# Patient Record
Sex: Male | Born: 1990 | Race: Black or African American | Hispanic: No | Marital: Single | Smoking: Current some day smoker
Health system: Southern US, Community
[De-identification: ages and names within clinical notes are randomized; demographics above are authoritative.]

## PROBLEM LIST (undated history)

## (undated) DIAGNOSIS — J45909 Unspecified asthma, uncomplicated: Secondary | ICD-10-CM

## (undated) HISTORY — DX: Unspecified asthma, uncomplicated: J45.909

---
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
## 2012-07-15 ENCOUNTER — Emergency Department (HOSPITAL_COMMUNITY)
Admission: EM | Admit: 2012-07-15 | Discharge: 2012-07-15 | Disposition: A | Discharge status: Home / Self Care | Payer: Medicaid Other | Source: Home / Self Care

## 2013-11-30 ENCOUNTER — Ambulatory Visit: Payer: Self-pay

## 2013-11-30 ENCOUNTER — Ambulatory Visit: Payer: Self-pay | Admitting: Family Medicine

## 2013-11-30 VITALS — BP 98/60 | HR 66 | Temp 98.2°F | Ht 66.0 in | Wt 130.0 lb

## 2013-11-30 DIAGNOSIS — M25521 Pain in right elbow: Secondary | ICD-10-CM

## 2013-11-30 DIAGNOSIS — M25529 Pain in unspecified elbow: Secondary | ICD-10-CM

## 2013-11-30 DIAGNOSIS — I8390 Asymptomatic varicose veins of unspecified lower extremity: Secondary | ICD-10-CM

## 2013-11-30 MED ORDER — PREDNISONE 20 MG PO TABS: ORAL_TABLET | ORAL | Status: DC

## 2013-11-30 NOTE — Patient Instructions (Signed)
Compression stockings:  [facility] in [place]:  [phone] 30-40 compression Open toe Beige or black Medium Knee high    Varicose Veins Varicose veins are veins that have become enlarged and twisted. CAUSES This condition is the result of valves in the veins not working properly. Valves in the veins help return blood from the leg to the heart. If these valves are damaged, blood flows backwards and backs up into the veins in the leg near the skin. This causes the veins to become larger. People who are on their feet a lot, who are pregnant, or who are overweight are more likely to develop varicose veins. SYMPTOMS   Bulging, twisted-appearing, bluish veins, most commonly found on the legs.  Leg pain or a feeling of heaviness. These symptoms may be worse at the end of the day.  Leg swelling.  Skin color changes. DIAGNOSIS  Varicose veins can usually be diagnosed with an exam of your legs by your caregiver. He or she may recommend an ultrasound of your leg veins. TREATMENT  Most varicose veins can be treated at home.However, other treatments are available for people who have persistent symptoms or who want to treat the cosmetic appearance of the varicose veins. These include:  Laser treatment of very small varicose veins.  Medicine that is shot (injected) into the vein. This medicine hardens the walls of the vein and closes off the vein. This treatment is called sclerotherapy. Afterwards, you may need to wear clothing or bandages that apply pressure.  Surgery. HOME CARE INSTRUCTIONS   Do not stand or sit in one position for long periods of time. Do not sit with your legs crossed. Rest with your legs raised during the day.  Wear elastic stockings or support hose. Do not wear other tight, encircling garments around the legs, pelvis, or waist.  Walk as much as possible to increase blood flow.  Raise the foot of your bed at night with 2-inch blocks.  If you get a cut in  the skin over the vein and the vein bleeds, lie down with your leg raised and press on it with a clean cloth until the bleeding stops. Then place a bandage (dressing) on the cut. See your caregiver if it continues to bleed or needs stitches. SEEK MEDICAL CARE IF:   The skin around your ankle starts to break down.  You have pain, redness, tenderness, or hard swelling developing in your leg over a vein.  You are uncomfortable due to leg pain. Document Released: 04/05/2005 Document Revised: 09/18/2011 Document Reviewed: 08/22/2010 [facility] Patient Information 2014 [place], [place].

## 2013-11-30 NOTE — Progress Notes (Addendum)
This is a 23 year old gentleman who works at [employer] He's had 2-3 months of pain in his right elbow, unrelated to any trauma. Has a popping sound as he pronates and supinates his wrist.  Patient also has a swelling on his left lateral Mid anterior shin that pops up when he stands and disappears when he lies down.  Minimal tenderness.  Objective:  NAD Patient has full range of motion of his right elbow. He has no real tenderness on palpation of the forearm or the epicondyles. Nevertheless when he pronates and supinates, there is a definite crepitus (standing sound).  The left leg shows a 1 cm area of swelling in the mid shaft of the fibula. [facility] reading (PRIMARY) by  Dr. Milus Glazier:  Negative right elbow.   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 The right elbow apparently has acute inflammation from an irritated band of tendon.  Elbow pain, right - Plan: DG Elbow Complete Right, predniSONE (DELTASONE) 20 MG tablet  Varicose vein of leg --> compression stockings.  Will refer if patient requests Signed, Elvina Sidle, MD

## 2015-04-25 ENCOUNTER — Encounter (HOSPITAL_COMMUNITY): Payer: Self-pay | Admitting: *Deleted

## 2015-04-25 ENCOUNTER — Emergency Department (HOSPITAL_COMMUNITY)
Admission: EM | Admit: 2015-04-25 | Discharge: 2015-04-25 | Discharge status: Absent without leave | Payer: Self-pay | Source: Home / Self Care

## 2015-04-25 ENCOUNTER — Emergency Department (INDEPENDENT_AMBULATORY_CARE_PROVIDER_SITE_OTHER)
Admission: EM | Admit: 2015-04-25 | Discharge: 2015-04-25 | Disposition: A | Discharge status: Home / Self Care | Payer: Self-pay | Source: Home / Self Care | Attending: Family Medicine | Admitting: Family Medicine

## 2015-04-25 DIAGNOSIS — S29011A Strain of muscle and tendon of front wall of thorax, initial encounter: Secondary | ICD-10-CM

## 2015-04-25 DIAGNOSIS — M7711 Lateral epicondylitis, right elbow: Secondary | ICD-10-CM

## 2015-04-25 MED ORDER — PREDNISONE 20 MG PO TABS: ORAL_TABLET | ORAL | Status: DC

## 2015-04-25 NOTE — ED Provider Notes (Signed)
CSN: [id number]     Arrival date & time 04/25/15  1758 History   First MD Initiated Contact with Patient 04/25/15 1815     Chief Complaint  Patient presents with  . Chest Pain   (Consider location/radiation/quality/duration/timing/severity/associated sxs/prior Treatment) Patient is a 24 y.o. male presenting with chest pain. The history is provided by the patient.  Chest Pain Pain location:  L chest Pain quality: aching and burning   Pain radiates to:  Does not radiate Pain radiates to the back: no   Pain severity:  Moderate Onset quality:  Gradual Timing:  Intermittent Progression:  Worsening Chronicity:  New Context: breathing, lifting, movement and raising an arm   Relieved by:  Nothing Worsened by:  Coughing Associated symptoms: cough    this is a 24 year old man who works at [employer] for the last 6 weeks. He's developed sharp pain in his left lateral chest. 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 He had a mild cough over the last 5 days as well.  Patient also has asthma which is been recently well-controlled using his inhalers.  Past Medical History  Diagnosis Date  . Asthma    History reviewed. No pertinent past surgical history. Family History  Problem Relation Age of Onset  . Diabetes Maternal Grandmother    Social History  Substance Use Topics  . Smoking status: Never Smoker   . Smokeless tobacco: None  . Alcohol Use: Yes     Comment: occasional    Review of Systems  Constitutional: Negative.   HENT: Negative.   Eyes: Negative.   Respiratory: Positive for cough.   Cardiovascular: Positive for chest pain.  Musculoskeletal: Negative.    patient does have a click in his right brachioradialis muscle for the last year.  Allergies  Review of patient's allergies indicates no known allergies.  Home Medications   Prior to Admission medications   Medication Sig Start Date End Date Taking? Authorizing Provider  Albuterol Sulfate (PROAIR HFA IN) Inhale into the lungs.   Yes  Historical Provider, MD  Cetirizine HCl (ZYRTEC PO) Take by mouth.    Historical Provider, MD  predniSONE (DELTASONE) 20 MG tablet 2 daily with food 11/30/13   Elvina Sidle, MD   Meds Ordered and Administered this Visit  Medications - No data to display  BP 123/67 mmHg  Pulse 64  Temp(Src) 98.3 F (36.8 C) (Oral)  Resp 16  SpO2 100% No data found.   Physical Exam  Constitutional: He appears well-developed.  HENT:  Head: Normocephalic and atraumatic.  Eyes: Conjunctivae are normal. Pupils are equal, round, and reactive to light.  Neck: Normal range of motion. Neck supple.  Cardiovascular: Normal rate, regular rhythm and normal heart sounds.   Pulmonary/Chest: Effort normal and breath sounds normal.  Musculoskeletal:  Patient has a palpable click when he pronates and supinates his right forearm. The clinic is located over the brachioradialis muscle. There is no ecchymosis and he has full range of motion  Nursing note and vitals reviewed.  patient has tenderness over the fifth rib on the left side  ED Course  Procedures (including critical care time)  Labs Review Labs Reviewed - No data to display      MDM   This chart was scribed in my presence and reviewed by me personally.    ICD-9-CM ICD-10-CM   1. Chest wall muscle strain, initial encounter 848.8 S29.011A   2. Right lateral epicondylitis 726.32 M77.11    treatment is prednisone 40 mg daily 5 and 1  day off from work   Signed, Elvina SidleKurt Kendyl Bissonnette, MD     Elvina SidleKurt Domini Vandehei, MD 04/25/15 [phone]

## 2015-04-25 NOTE — ED Notes (Signed)
C/O left lateral rib pain with coughing; has had productive cough x 1 wk.  Has tried using ProAir, but doesn't notice any change.  Denies fevers.

## 2015-04-25 NOTE — Discharge Instructions (Signed)
° °  You have the muscle in the left chest wall. Usually the cortisone helps with the cough as well as the inflammation where the muscle attaches. Also, this may help with your right tennis elbow.   Muscle Strain A muscle strain is an injury that occurs when a muscle is stretched beyond its normal length. Usually a small number of muscle fibers are torn when this happens. Muscle strain is rated in degrees. First-degree strains have the least amount of muscle fiber tearing and pain. Second-degree and third-degree strains have increasingly more tearing and pain.  Usually, recovery from muscle strain takes 1-2 weeks. Complete healing takes 5-6 weeks.  CAUSES  Muscle strain happens when a sudden, violent force placed on a muscle stretches it too far. This may occur with lifting, sports, or a fall.  RISK FACTORS Muscle strain is especially common in athletes.  SIGNS AND SYMPTOMS At the site of the muscle strain, there may be:  Pain.  Bruising.  Swelling.  Difficulty using the muscle due to pain or lack of normal function. DIAGNOSIS  Your health care provider will perform a physical exam and ask about your medical history. TREATMENT  Often, the best treatment for a muscle strain is resting, icing, and applying cold compresses to the injured area.  HOME CARE INSTRUCTIONS   Use the PRICE method of treatment to promote muscle healing during the first 2-3 days after your injury. The PRICE method involves:  Protecting the muscle from being injured again.  Restricting your activity and resting the injured body part.  Icing your injury. To do this, put ice in a plastic bag. Place a towel between your skin and the bag. Then, apply the ice and leave it on from 15-20 minutes each hour. After the third day, switch to moist heat packs.  Apply compression to the injured area with a splint or elastic bandage. Be careful not to wrap it too tightly. This may interfere with blood circulation or increase  swelling.  Elevate the injured body part above the level of your heart as often as you can.  Only take over-the-counter or prescription medicines for pain, discomfort, or fever as directed by your health care provider.  Warming up prior to exercise helps to prevent future muscle strains. SEEK MEDICAL CARE IF:   You have increasing pain or swelling in the injured area.  You have numbness, tingling, or a significant loss of strength in the injured area. MAKE SURE YOU:   Understand these instructions.  Will watch your condition.  Will get help right away if you are not doing well or get worse.   This information is not intended to replace advice given to you by your health care provider. Make sure you discuss any questions you have with your health care provider.   Document Released: 06/26/2005 Document Revised: 04/16/2013 Document Reviewed: 01/23/2013 Elsevier Interactive Patient Education [employer]2016 Elsevier Inc.

## 2015-07-08 ENCOUNTER — Ambulatory Visit (INDEPENDENT_AMBULATORY_CARE_PROVIDER_SITE_OTHER): Payer: Self-pay | Admitting: Emergency Medicine

## 2015-07-08 DIAGNOSIS — J45909 Unspecified asthma, uncomplicated: Secondary | ICD-10-CM | POA: Insufficient documentation

## 2015-07-08 DIAGNOSIS — J209 Acute bronchitis, unspecified: Secondary | ICD-10-CM

## 2015-07-08 DIAGNOSIS — J4530 Mild persistent asthma, uncomplicated: Secondary | ICD-10-CM

## 2015-07-08 MED ORDER — PROMETHAZINE-CODEINE 6.25-10 MG/5ML PO SYRP
5.0000 mL | ORAL_SOLUTION | ORAL | Status: DC | PRN
Start: 2015-07-08 — End: 2016-05-29

## 2015-07-08 MED ORDER — AZITHROMYCIN 250 MG PO TABS: ORAL_TABLET | ORAL | Status: DC

## 2015-07-08 NOTE — Progress Notes (Signed)
Subjective:  Patient ID: Stephannie Li, male    DOB: Oct 11, 1990  Age: 24 y.o. MRN: [id number]  CC: No chief complaint on file.   HPI Clayvon A Gillie presents  patient has a four-day history of sore throat and hoarseness. He has nasal congestion and postnasal drainage. He has a history of asthma and has some wheezing with minimal shortness breath with exertion. He has a cough productive of purulent sputum. He vomiting or stool change.  History Toshio has a past medical history of Asthma.   He has no past surgical history on file.   His  family history includes Diabetes in his maternal grandmother.  He   reports that he has never smoked. He does not have any smokeless tobacco history on file. He reports that he drinks alcohol. He reports that he does not use illicit drugs.  Outpatient Prescriptions Prior to Visit  Medication Sig Dispense Refill  . Albuterol Sulfate (PROAIR HFA IN) Inhale into the lungs.    . Cetirizine HCl (ZYRTEC PO) Take by mouth.    . predniSONE (DELTASONE) 20 MG tablet Two daily with food 10 tablet 0   No facility-administered medications prior to visit.    Social History   Social History  . Marital Status: Single    Spouse Name: N/A  . Number of Children: N/A  . Years of Education: N/A   Social History Main Topics  . Smoking status: Never Smoker   . Smokeless tobacco: Not on file  . Alcohol Use: Yes     Comment: occasional  . Drug Use: No  . Sexual Activity: Not on file   Other Topics Concern  . Not on file   Social History Narrative     Review of Systems  Constitutional: Positive for fever and fatigue. Negative for chills and appetite change.  HENT: Positive for sore throat. Negative for congestion, ear pain, postnasal drip and sinus pressure.   Eyes: Negative for pain and redness.  Respiratory: Positive for cough. Negative for shortness of breath and wheezing.   Cardiovascular: Negative for leg swelling.  Gastrointestinal: Negative  for nausea, vomiting, abdominal pain, diarrhea, constipation and blood in stool.  Endocrine: Negative for polyuria.  Genitourinary: Negative for dysuria, urgency, frequency and flank pain.  Musculoskeletal: Negative for gait problem.  Skin: Negative for rash.  Neurological: Negative for weakness and headaches.  Psychiatric/Behavioral: Negative for confusion and decreased concentration. The patient is not nervous/anxious.     Objective:  There were no vitals taken for this visit.  Physical Exam  Constitutional: He is oriented to person, place, and time. He appears well-developed and well-nourished. No distress.  HENT:  Head: Normocephalic and atraumatic.  Right Ear: External ear normal.  Left Ear: External ear normal.  Nose: Nose normal.  Eyes: Conjunctivae and EOM are normal. Pupils are equal, round, and reactive to light. No scleral icterus.  Neck: Normal range of motion. Neck supple. No tracheal deviation present.  Cardiovascular: Normal rate, regular rhythm and normal heart sounds.   Pulmonary/Chest: Effort normal. No respiratory distress. He has no wheezes. He has no rales.  Abdominal: He exhibits no mass. There is no tenderness. There is no rebound and no guarding.  Musculoskeletal: He exhibits no edema.  Lymphadenopathy:    He has no cervical adenopathy.  Neurological: He is alert and oriented to person, place, and time. Coordination normal.  Skin: Skin is warm and dry. No rash noted.  Psychiatric: He has a normal mood and affect. His behavior  is normal.      Assessment & Plan:   Diagnoses and all orders for this visit:  Acute bronchitis, unspecified organism  Asthma, mild persistent, uncomplicated  Other orders -     promethazine-codeine (PHENERGAN WITH CODEINE) 6.25-10 MG/5ML syrup; Take 5-10 mLs by mouth every 4 (four) hours as needed for cough. -     azithromycin (ZITHROMAX) 250 MG tablet; Take 2 tabs PO x 1 dose, then 1 tab PO QD x 4 days  I am having Mr.  Katrinka BlazingSmith start on promethazine-codeine and azithromycin. I am also having him maintain his Cetirizine HCl (ZYRTEC PO), Albuterol Sulfate (PROAIR HFA IN), and predniSONE.  Meds ordered this encounter  Medications  . promethazine-codeine (PHENERGAN WITH CODEINE) 6.25-10 MG/5ML syrup    Sig: Take 5-10 mLs by mouth every 4 (four) hours as needed for cough.    Dispense:  120 mL    Refill:  0  . azithromycin (ZITHROMAX) 250 MG tablet    Sig: Take 2 tabs PO x 1 dose, then 1 tab PO QD x 4 days    Dispense:  6 tablet    Refill:  0    Appropriate red flag conditions were discussed with the patient as well as actions that should be taken.  Patient expressed his understanding.  Follow-up: Return if symptoms worsen or fail to improve.  Carmelina DaneAnderson, Nadja Lina S, MD

## 2015-07-08 NOTE — Patient Instructions (Signed)

## 2016-05-29 ENCOUNTER — Ambulatory Visit (INDEPENDENT_AMBULATORY_CARE_PROVIDER_SITE_OTHER): Payer: BLUE CROSS/BLUE SHIELD | Admitting: Family Medicine

## 2016-05-29 ENCOUNTER — Ambulatory Visit (INDEPENDENT_AMBULATORY_CARE_PROVIDER_SITE_OTHER): Payer: BLUE CROSS/BLUE SHIELD

## 2016-05-29 VITALS — BP 122/72 | HR 102 | Temp 101.0°F | Resp 17 | Ht 67.5 in | Wt 130.0 lb

## 2016-05-29 DIAGNOSIS — R059 Cough, unspecified: Secondary | ICD-10-CM

## 2016-05-29 DIAGNOSIS — J181 Lobar pneumonia, unspecified organism: Secondary | ICD-10-CM | POA: Diagnosis not present

## 2016-05-29 DIAGNOSIS — J029 Acute pharyngitis, unspecified: Secondary | ICD-10-CM | POA: Diagnosis not present

## 2016-05-29 DIAGNOSIS — R509 Fever, unspecified: Secondary | ICD-10-CM

## 2016-05-29 DIAGNOSIS — J189 Pneumonia, unspecified organism: Secondary | ICD-10-CM

## 2016-05-29 DIAGNOSIS — J4520 Mild intermittent asthma, uncomplicated: Secondary | ICD-10-CM

## 2016-05-29 DIAGNOSIS — R05 Cough: Secondary | ICD-10-CM

## 2016-05-29 LAB — POCT CBC
Granulocyte percent: 88.5 %G — AB (ref 37–80)
HCT, POC: 41.7 % — AB (ref 43.5–53.7)
Hemoglobin: 14.2 g/dL (ref 14.1–18.1)
Lymph, poc: 0.9 (ref 0.6–3.4)
MCH, POC: 24.9 pg — AB (ref 27–31.2)
MCHC: 34.1 g/dL (ref 31.8–35.4)
MCV: 73 fL — AB (ref 80–97)
MID (cbc): 0.2 (ref 0–0.9)
MPV: 8.6 fL (ref 0–99.8)
POC Granulocyte: 9 — AB (ref 2–6.9)
POC LYMPH PERCENT: 9.1 %L — AB (ref 10–50)
POC MID %: 2.4 %M (ref 0–12)
Platelet Count, POC: 231 10*3/uL (ref 142–424)
RBC: 5.72 M/uL (ref 4.69–6.13)
RDW, POC: 14.2 %
WBC: 10.2 10*3/uL (ref 4.6–10.2)

## 2016-05-29 LAB — POCT RAPID STREP A (OFFICE): Rapid Strep A Screen: NEGATIVE

## 2016-05-29 LAB — POCT INFLUENZA A/B
Influenza A, POC: NEGATIVE
Influenza B, POC: NEGATIVE

## 2016-05-29 MED ORDER — AZITHROMYCIN 250 MG PO TABS
ORAL_TABLET | ORAL | 0 refills | Status: DC
Start: 2016-05-29 — End: 2016-06-02

## 2016-05-29 MED ORDER — ALBUTEROL SULFATE HFA 108 (90 BASE) MCG/ACT IN AERS: 1.0000 | INHALATION_SPRAY | RESPIRATORY_TRACT | 0 refills | Status: DC | PRN

## 2016-05-29 MED ORDER — ALBUTEROL SULFATE (2.5 MG/3ML) 0.083% IN NEBU
2.5000 mg | INHALATION_SOLUTION | Freq: Once | RESPIRATORY_TRACT | Status: AC
  Administered 2016-05-29: 2.5 mg via RESPIRATORY_TRACT

## 2016-05-29 NOTE — Patient Instructions (Addendum)
I'm suspicious for an early pneumonia based on your symptoms and exam. The x-ray and blood work were overall reassuring. Your strep test and flu tests were also negative. Start antibiotic today, 2 pills today then 1 pill per day for the next 4 days. Albuterol inhaler up to every 4 hours as needed. If you aren't improving, follow-up with me on Friday, if any persistent fevers, or not improving by Wednesday, return for recheck then.   Return to the clinic or go to the nearest emergency room if any of your symptoms worsen or new symptoms occur.   Community-Acquired Pneumonia, Adult Pneumonia is an infection of the lungs. There are different types of pneumonia. One type can develop while a person is in a hospital. A different type, called community-acquired pneumonia, develops in people who are not, or have not recently been, in the hospital or other health care facility. What are the causes? Pneumonia may be caused by bacteria, viruses, or funguses. Community-acquired pneumonia is often caused by Streptococcus pneumonia bacteria. These bacteria are often passed from one person to another by breathing in droplets from the cough or sneeze of an infected person. What increases the risk? The condition is more likely to develop in:  People who havechronic diseases, such as chronic obstructive pulmonary disease (COPD), asthma, congestive heart failure, cystic fibrosis, diabetes, or kidney disease.  People who haveearly-stage or late-stage HIV.  People who havesickle cell disease.  People who havehad their spleen removed (splenectomy).  People who havepoor Administrator.  People who havemedical conditions that increase the risk of breathing in (aspirating) secretions their own mouth and nose.  People who havea weakened immune system (immunocompromised).  People who smoke.  People whotravel to areas where pneumonia-causing germs commonly exist.  People whoare around animal habitats or  animals that have pneumonia-causing germs, including birds, bats, rabbits, cats, and farm animals. What are the signs or symptoms? Symptoms of this condition include:  Adry cough.  A wet (productive) cough.  Fever.  Sweating.  Chest pain, especially when breathing deeply or coughing.  Rapid breathing or difficulty breathing.  Shortness of breath.  Shaking chills.  Fatigue.  Muscle aches. How is this diagnosed? Your health care provider will take a medical history and perform a physical exam. You may also have other tests, including:  Imaging studies of your chest, including X-rays.  Tests to check your blood oxygen level and other blood gases.  Other tests on blood, mucus (sputum), fluid around your lungs (pleural fluid), and urine. If your pneumonia is severe, other tests may be done to identify the specific cause of your illness. How is this treated? The type of treatment that you receive depends on many factors, such as the cause of your pneumonia, the medicines you take, and other medical conditions that you have. For most adults, treatment and recovery from pneumonia may occur at home. In some cases, treatment must happen in a hospital. Treatment may include:  Antibiotic medicines, if the pneumonia was caused by bacteria.  Antiviral medicines, if the pneumonia was caused by a virus.  Medicines that are given by mouth or through an IV tube.  Oxygen.  Respiratory therapy. Although rare, treating severe pneumonia may include:  Mechanical ventilation. This is done if you are not breathing well on your own and you cannot maintain a safe blood oxygen level.  Thoracentesis. This procedureremoves fluid around one lung or both lungs to help you breathe better. Follow these instructions at home:  Take over-the-counter and  prescription medicines only as told by your health care provider.  Only takecough medicine if you are losing sleep. Understand that cough medicine  can prevent your body's natural ability to remove mucus from your lungs.  If you were prescribed an antibiotic medicine, take it as told by your health care provider. Do not stop taking the antibiotic even if you start to feel better.  Sleep in a semi-upright position at night. Try sleeping in a reclining chair, or place a few pillows under your head.  Do not use tobacco products, including cigarettes, chewing tobacco, and e-cigarettes. If you need help quitting, ask your health care provider.  Drink enough water to keep your urine clear or pale yellow. This will help to thin out mucus secretions in your lungs. How is this prevented? There are ways that you can decrease your risk of developing community-acquired pneumonia. Consider getting a pneumococcal vaccine if:  You are older than 23 years of age.  You are older than 23 years of age and are undergoing cancer treatment, have chronic lung disease, or have other medical conditions that affect your immune system. Ask your health care provider if this applies to you. There are different types and schedules of pneumococcal vaccines. Ask your health care provider which vaccination option is best for you. You may also prevent community-acquired pneumonia if you take these actions:  Get an influenza vaccine every year. Ask your health care provider which type of influenza vaccine is best for you.  Go to the dentist on a regular basis.  Wash your hands often. Use hand sanitizer if soap and water are not available. Contact a health care provider if:  You have a fever.  You are losing sleep because you cannot control your cough with cough medicine. Get help right away if:  You have worsening shortness of breath.  You have increased chest pain.  Your sickness becomes worse, especially if you are an older adult or have a weakened immune system.  You cough up blood. This information is not intended to replace advice given to you by your  health care provider. Make sure you discuss any questions you have with your health care provider. Document Released: 06/26/2005 Document Revised: 11/04/2015 Document Reviewed: 10/21/2014 Elsevier Interactive Patient Education  2017 [employer]  Bronchospasm, Adult A bronchospasm is a spasm or tightening of the airways going into the lungs. During a bronchospasm breathing becomes more difficult because the airways get smaller. When this happens there can be coughing, a whistling sound when breathing (wheezing), and difficulty breathing. Bronchospasm is often associated with asthma, but not all patients who experience a bronchospasm have asthma. What are the causes? A bronchospasm is caused by inflammation or irritation of the airways. The inflammation or irritation may be triggered by:  Allergies (such as to animals, pollen, food, or mold). Allergens that cause bronchospasm may cause wheezing immediately after exposure or many hours later.  Infection. Viral infections are believed to be the most common cause of bronchospasm.  Exercise.  Irritants (such as pollution, cigarette smoke, strong odors, aerosol sprays, and paint fumes).  Weather changes. Winds increase molds and pollens in the air. Rain refreshes the air by washing irritants out. Cold air may cause inflammation.  Stress and emotional upset. What are the signs or symptoms?  Wheezing.  Excessive nighttime coughing.  Frequent or severe coughing with a simple cold.  Chest tightness.  Shortness of breath. How is this diagnosed? Bronchospasm is usually diagnosed through a history and  physical exam. Tests, such as chest X-rays, are sometimes done to look for other conditions. How is this treated?  Inhaled medicines can be given to open up your airways and help you breathe. The medicines can be given using either an inhaler or a nebulizer machine.  Corticosteroid medicines may be given for severe bronchospasm, usually when  it is associated with asthma. Follow these instructions at home:  Always have a plan prepared for seeking medical care. Know when to call your health care provider and local emergency services (911 in the [place] Know where you can access local emergency care.  Only take medicines as directed by your health care provider.  If you were prescribed an inhaler or nebulizer machine, ask your health care provider to explain how to use it correctly. Always use a spacer with your inhaler if you were given one.  It is necessary to remain calm during an attack. Try to relax and breathe more slowly.  Control your home environment in the following ways:  Change your heating and air conditioning filter at least once a month.  Limit your use of fireplaces and wood stoves.  Do not smoke and do not allow smoking in your home.  Avoid exposure to perfumes and fragrances.  Get rid of pests (such as roaches and mice) and their droppings.  Throw away plants if you see mold on them.  Keep your house clean and dust free.  Replace carpet with wood, tile, or vinyl flooring. Carpet can trap dander and dust.  Use allergy-proof pillows, mattress covers, and box spring covers.  Wash bed sheets and blankets every week in hot water and dry them in a dryer.  Use blankets that are made of polyester or cotton.  Wash hands frequently. Contact a health care provider if:  You have muscle aches.  You have chest pain.  The sputum changes from clear or white to yellow, green, gray, or bloody.  The sputum you cough up gets thicker.  There are problems that may be related to the medicine you are given, such as a rash, itching, swelling, or trouble breathing. Get help right away if:  You have worsening wheezing and coughing even after taking your prescribed medicines.  You have increased difficulty breathing.  You develop severe chest pain. This information is not intended to replace advice given to you by  your health care provider. Make sure you discuss any questions you have with your health care provider. Document Released: 06/29/2003 Document Revised: 12/02/2015 Document Reviewed: 12/16/2012 [employer] Interactive Patient Education  2017 [employer]     IF you received an x-ray today, you [facility]. Please contact [facility] at [phone] with questions or concerns regarding your invoice.   IF you received labwork today, you will receive an invoice from [facility]. Please contact [facility] at [phone] with questions or concerns regarding your invoice.   Our billing staff will not be able to assist you with questions regarding bills from these companies.  You will be contacted with the lab results as soon as they are available. The fastest way to get your results is to activate your My Chart account. Instructions are located on the last page of this paperwork. If you have not heard from us regarding the results in 2 weeks, please contact this office.

## 2016-05-29 NOTE — Progress Notes (Signed)
Subjective:  By signing my name below, I, [name], attest that this documentation has been prepared under the direction and in the presence of Shade Flood, MD Electronically Signed: Charline Bills, ED Scribe 05/29/2016 at 9:53 AM.   Patient ID: Dustin Weber, male    DOB: 1991-03-05, 25 y.o.   MRN: [id number]  Chief Complaint  Patient presents with  . Cough  . URI   HPI  HPI Comments: Dustin Weber is a 25 y.o. male, with a h/o asthma, who presents to the [facility] complaining of ongoing cough for the past 3 days. Pt reports associated symptoms of sore throat, HA, mild wheezing and generalized body aches. Pt has tried OTC cough and congestion medication every 4-6 hours for the past 2 days without significant relief. He does not currently have an albuterol inhaler but has used one in the past for asthma. Pt states that his older brother was ill with similar symptoms last week. He denies recent travel. Pt did not receive a flu vaccine this season. He works for [employer] and recently started working for [employer] for [employer] in October.  Patient Active Problem List   Diagnosis Date Noted  . Asthma 07/08/2015   Past Medical History:  Diagnosis Date  . Asthma    No past surgical history on file. No Known Allergies Prior to Admission medications   Medication Sig Start Date End Date Taking? Authorizing Provider  Homeopathic Products ([facility] ELDERBERRY COLD+FLU PO) Take by mouth.   Yes Historical Provider, MD  Albuterol Sulfate (PROAIR HFA IN) Inhale into the lungs.    Historical Provider, MD   Social History   Social History  . Marital status: Single    Spouse name: N/A  . Number of children: N/A  . Years of education: N/A   Occupational History  . Not on file.   Social History Main Topics  . Smoking status: Current Every Day Smoker    Packs/day: 0.20    Years: 3.00  . Smokeless tobacco: Never Used  . Alcohol use Yes     Comment: occasional  .  Drug use: No  . Sexual activity: No   Other Topics Concern  . Not on file   Social History Narrative  . No narrative on file   Review of Systems  HENT: Positive for sore throat.   Respiratory: Positive for cough and wheezing.   Musculoskeletal: Positive for myalgias.  Neurological: Positive for headaches.   BP 122/72 (BP Location: Right Arm, Patient Position: Sitting, Cuff Size: Normal)   Pulse (!) 102   Temp (!) 101 F (38.3 C) (Oral)   Resp 17   Ht 5' 7.5" (1.715 m)   Wt 130 lb (59 kg)   SpO2 97%   BMI 20.06 kg/m     Objective:   Physical Exam  Constitutional: He is oriented to person, place, and time. He appears well-developed and well-nourished.  HENT:  Head: Normocephalic and atraumatic.  Right Ear: Tympanic membrane, external ear and ear canal normal.  Left Ear: Tympanic membrane, external ear and ear canal normal.  Nose: No rhinorrhea.  Mouth/Throat: Mucous membranes are normal. Posterior oropharyngeal erythema present. No oropharyngeal exudate.  Eyes: Conjunctivae are normal. Pupils are equal, round, and reactive to light.  Neck: Neck supple.  Cardiovascular: Normal rate, regular rhythm, normal heart sounds and intact distal pulses.   No murmur heard. Pulmonary/Chest: Effort normal. He has wheezes (faint, expiratory on the R and slight inspiratory wheeze).  He has rhonchi in the left lower field. He has no rales.  Few coarse scattered breath sounds  Abdominal: Soft. There is no tenderness.  Lymphadenopathy:    He has no cervical adenopathy.  Neurological: He is alert and oriented to person, place, and time.  Skin: Skin is warm and dry. No rash noted.  Psychiatric: He has a normal mood and affect. His behavior is normal.  Vitals reviewed.  Results for orders placed or performed in visit on 05/29/16  POCT CBC  Result Value Ref Range   WBC 10.2 4.6 - 10.2 K/uL   Lymph, poc 0.9 0.6 - 3.4   POC LYMPH PERCENT 9.1 (A) 10 - 50 %L   MID (cbc) 0.2 0 - 0.9   POC  MID % 2.4 0 - 12 %M   POC Granulocyte 9.0 (A) 2 - 6.9   Granulocyte percent 88.5 (A) 37 - 80 %G   RBC 5.72 4.69 - 6.13 M/uL   Hemoglobin 14.2 14.1 - 18.1 g/dL   HCT, POC 16.141.7 (A) 09.643.5 - 53.7 %   MCV 73.0 (A) 80 - 97 fL   MCH, POC 24.9 (A) 27 - 31.2 pg   MCHC 34.1 31.8 - 35.4 g/dL   RDW, POC 04.514.2 %   Platelet Count, POC 231 142 - 424 K/uL   MPV 8.6 0 - 99.8 fL  POCT Influenza A/B  Result Value Ref Range   Influenza A, POC Negative Negative   Influenza B, POC Negative Negative  POCT rapid strep A  Result Value Ref Range   Rapid Strep A Screen Negative Negative      Assessment & Plan:   10:33 AM- Albuterol 2.5 neb was given. Wheeze has resolved and improved. Increased aeration left lower lobe but still few rhonchi.   Dustin Weber is a 25 y.o. male Community acquired pneumonia of left lower lobe of lung (HCC) - Plan: azithromycin (ZITHROMAX) 250 MG tablet  Cough - Plan: POCT CBC, POCT Influenza A/B, DG Chest 2 View  Fever, unspecified fever cause - Plan: POCT CBC, POCT Influenza A/B, DG Chest 2 View  Mild intermittent asthma, unspecified whether complicated - Plan: albuterol (PROVENTIL) (2.5 MG/3ML) 0.083% nebulizer solution 2.5 mg, albuterol (PROVENTIL HFA;VENTOLIN HFA) 108 (90 Base) MCG/ACT inhaler  Sore throat - Plan: POCT rapid strep A   Three-day history of fever, myalgias, cough. Few rhonchi left lower lobe with wheezing history of asthma. Somewhat improved after neb, but persistent rhonchi left lower lobe concerning for early community acquired pneumonia.   -Start azithromycin Z-Pak  -Albuterol neb given with improvement., proair HFA given every 4 hours when necessary.  - Recheck in 2 days if not significantly improved, otherwise 4 days with me. RTC precautions discussed. Out of work note given.  Meds ordered this encounter  Medications  . Homeopathic Products (UMCKA ELDERBERRY COLD+FLU PO)    Sig: Take by mouth.  Marland Kitchen. albuterol (PROVENTIL) (2.5 MG/3ML) 0.083%  nebulizer solution 2.5 mg  . albuterol (PROVENTIL HFA;VENTOLIN HFA) 108 (90 Base) MCG/ACT inhaler    Sig: Inhale 1-2 puffs into the lungs every 4 (four) hours as needed for wheezing or shortness of breath.    Dispense:  1 Inhaler    Refill:  0  . azithromycin (ZITHROMAX) 250 MG tablet    Sig: Take 2 pills by mouth on day 1, then 1 pill by mouth per day on days 2 through 5.    Dispense:  6 tablet    Refill:  0  Patient Instructions    I'm suspicious for an early pneumonia based on your symptoms and exam. The x-ray and blood work were overall reassuring. Your strep test and flu tests were also negative. Start antibiotic today, 2 pills today then 1 pill per day for the next 4 days. Albuterol inhaler up to every 4 hours as needed. If you aren't improving, follow-up with me on Friday, if any persistent fevers, or not improving by Wednesday, return for recheck then.   Return to the clinic or go to the nearest emergency room if any of your symptoms worsen or new symptoms occur.   Community-Acquired Pneumonia, Adult Pneumonia is an infection of the lungs. There are different types of pneumonia. One type can develop while a person is in a hospital. A different type, called community-acquired pneumonia, develops in people who are not, or have not recently been, in the hospital or other health care facility. What are the causes? Pneumonia may be caused by bacteria, viruses, or funguses. Community-acquired pneumonia is often caused by Streptococcus pneumonia bacteria. These bacteria are often passed from one person to another by breathing in droplets from the cough or sneeze of an infected person. What increases the risk? The condition is more likely to develop in:  People who havechronic diseases, such as chronic obstructive pulmonary disease (COPD), asthma, congestive heart failure, cystic fibrosis, diabetes, or kidney disease.  People who haveearly-stage or late-stage HIV.  People who  havesickle cell disease.  People who havehad their spleen removed (splenectomy).  People who havepoor Administratordental hygiene.  People who havemedical conditions that increase the risk of breathing in (aspirating) secretions their own mouth and nose.  People who havea weakened immune system (immunocompromised).  People who smoke.  People whotravel to areas where pneumonia-causing germs commonly exist.  People whoare around animal habitats or animals that have pneumonia-causing germs, including birds, bats, rabbits, cats, and farm animals. What are the signs or symptoms? Symptoms of this condition include:  Adry cough.  A wet (productive) cough.  Fever.  Sweating.  Chest pain, especially when breathing deeply or coughing.  Rapid breathing or difficulty breathing.  Shortness of breath.  Shaking chills.  Fatigue.  Muscle aches. How is this diagnosed? Your health care provider will take a medical history and perform a physical exam. You may also have other tests, including:  Imaging studies of your chest, including X-rays.  Tests to check your blood oxygen level and other blood gases.  Other tests on blood, mucus (sputum), fluid around your lungs (pleural fluid), and urine. If your pneumonia is severe, other tests may be done to identify the specific cause of your illness. How is this treated? The type of treatment that you receive depends on many factors, such as the cause of your pneumonia, the medicines you take, and other medical conditions that you have. For most adults, treatment and recovery from pneumonia may occur at home. In some cases, treatment must happen in a hospital. Treatment may include:  Antibiotic medicines, if the pneumonia was caused by bacteria.  Antiviral medicines, if the pneumonia was caused by a virus.  Medicines that are given by mouth or through an IV tube.  Oxygen.  Respiratory therapy. Although rare, treating severe pneumonia may  include:  Mechanical ventilation. This is done if you are not breathing well on your own and you cannot maintain a safe blood oxygen level.  Thoracentesis. This procedureremoves fluid around one lung or both lungs to help you breathe better. Follow these instructions  at home:  Take over-the-counter and prescription medicines only as told by your health care provider.  Only takecough medicine if you are losing sleep. Understand that cough medicine can prevent your body's natural ability to remove mucus from your lungs.  If you were prescribed an antibiotic medicine, take it as told by your health care provider. Do not stop taking the antibiotic even if you start to feel better.  Sleep in a semi-upright position at night. Try sleeping in a reclining chair, or place a few pillows under your head.  Do not use tobacco products, including cigarettes, chewing tobacco, and e-cigarettes. If you need help quitting, ask your health care provider.  Drink enough water to keep your urine clear or pale yellow. This will help to thin out mucus secretions in your lungs. How is this prevented? There are ways that you can decrease your risk of developing community-acquired pneumonia. Consider getting a pneumococcal vaccine if:  You are older than 23 years of age.  You are older than 23 years of age and are undergoing cancer treatment, have chronic lung disease, or have other medical conditions that affect your immune system. Ask your health care provider if this applies to you. There are different types and schedules of pneumococcal vaccines. Ask your health care provider which vaccination option is best for you. You may also prevent community-acquired pneumonia if you take these actions:  Get an influenza vaccine every year. Ask your health care provider which type of influenza vaccine is best for you.  Go to the dentist on a regular basis.  Wash your hands often. Use hand sanitizer if soap and water  are not available. Contact a health care provider if:  You have a fever.  You are losing sleep because you cannot control your cough with cough medicine. Get help right away if:  You have worsening shortness of breath.  You have increased chest pain.  Your sickness becomes worse, especially if you are an older adult or have a weakened immune system.  You cough up blood. This information is not intended to replace advice given to you by your health care provider. Make sure you discuss any questions you have with your health care provider. Document Released: 06/26/2005 Document Revised: 11/04/2015 Document Reviewed: 10/21/2014 Elsevier Interactive Patient Education  2017 [employer]  Bronchospasm, Adult A bronchospasm is a spasm or tightening of the airways going into the lungs. During a bronchospasm breathing becomes more difficult because the airways get smaller. When this happens there can be coughing, a whistling sound when breathing (wheezing), and difficulty breathing. Bronchospasm is often associated with asthma, but not all patients who experience a bronchospasm have asthma. What are the causes? A bronchospasm is caused by inflammation or irritation of the airways. The inflammation or irritation may be triggered by:  Allergies (such as to animals, pollen, food, or mold). Allergens that cause bronchospasm may cause wheezing immediately after exposure or many hours later.  Infection. Viral infections are believed to be the most common cause of bronchospasm.  Exercise.  Irritants (such as pollution, cigarette smoke, strong odors, aerosol sprays, and paint fumes).  Weather changes. Winds increase molds and pollens in the air. Rain refreshes the air by washing irritants out. Cold air may cause inflammation.  Stress and emotional upset. What are the signs or symptoms?  Wheezing.  Excessive nighttime coughing.  Frequent or severe coughing with a simple cold.  Chest  tightness.  Shortness of breath. How is this diagnosed? Bronchospasm is  usually diagnosed through a history and physical exam. Tests, such as chest X-rays, are sometimes done to look for other conditions. How is this treated?  Inhaled medicines can be given to open up your airways and help you breathe. The medicines can be given using either an inhaler or a nebulizer machine.  Corticosteroid medicines may be given for severe bronchospasm, usually when it is associated with asthma. Follow these instructions at home:  Always have a plan prepared for seeking medical care. Know when to call your health care provider and local emergency services (911 in the [place] Know where you can access local emergency care.  Only take medicines as directed by your health care provider.  If you were prescribed an inhaler or nebulizer machine, ask your health care provider to explain how to use it correctly. Always use a spacer with your inhaler if you were given one.  It is necessary to remain calm during an attack. Try to relax and breathe more slowly.  Control your home environment in the following ways:  Change your heating and air conditioning filter at least once a month.  Limit your use of fireplaces and wood stoves.  Do not smoke and do not allow smoking in your home.  Avoid exposure to perfumes and fragrances.  Get rid of pests (such as roaches and mice) and their droppings.  Throw away plants if you see mold on them.  Keep your house clean and dust free.  Replace carpet with wood, tile, or vinyl flooring. Carpet can trap dander and dust.  Use allergy-proof pillows, mattress covers, and box spring covers.  Wash bed sheets and blankets every week in hot water and dry them in a dryer.  Use blankets that are made of polyester or cotton.  Wash hands frequently. Contact a health care provider if:  You have muscle aches.  You have chest pain.  The sputum changes from clear or white  to yellow, green, gray, or bloody.  The sputum you cough up gets thicker.  There are problems that may be related to the medicine you are given, such as a rash, itching, swelling, or trouble breathing. Get help right away if:  You have worsening wheezing and coughing even after taking your prescribed medicines.  You have increased difficulty breathing.  You develop severe chest pain. This information is not intended to replace advice given to you by your health care provider. Make sure you discuss any questions you have with your health care provider. Document Released: 06/29/2003 Document Revised: 12/02/2015 Document Reviewed: 12/16/2012 Elsevier Interactive Patient Education  2017 [employer].     IF you received an x-ray today, you will receive an invoice from [facility]. Please contact [facility] at [phone] with questions or concerns regarding your invoice.   IF you received labwork today, you will receive an invoice from [employer] [facility]. Please contact [facility] at [phone] with questions or concerns regarding your invoice.   Our billing staff will not be able to assist you with questions regarding bills from these companies.  You will be contacted with the lab results as soon as they are available. The fastest way to get your results is to activate your My Chart account. Instructions are located on the last page of this paperwork. If you have not heard from [place] regarding the results in 2 weeks, please contact this office.        I personally performed the services described in this documentation, which was scribed in  my presence. The recorded information has been reviewed and considered, and addended by me as needed.   Signed,   Meredith Staggers, MD [facility].  05/29/16 10:39 AM

## 2016-06-02 ENCOUNTER — Ambulatory Visit (INDEPENDENT_AMBULATORY_CARE_PROVIDER_SITE_OTHER): Payer: BLUE CROSS/BLUE SHIELD | Admitting: Family Medicine

## 2016-06-02 VITALS — BP 120/80 | HR 69 | Temp 98.5°F | Resp 20 | Ht 67.0 in | Wt 128.0 lb

## 2016-06-02 DIAGNOSIS — J4521 Mild intermittent asthma with (acute) exacerbation: Secondary | ICD-10-CM | POA: Diagnosis not present

## 2016-06-02 DIAGNOSIS — J181 Lobar pneumonia, unspecified organism: Secondary | ICD-10-CM

## 2016-06-02 DIAGNOSIS — J189 Pneumonia, unspecified organism: Secondary | ICD-10-CM

## 2016-06-02 MED ORDER — AZITHROMYCIN 250 MG PO TABS: 250.0000 mg | ORAL_TABLET | Freq: Once | ORAL | 0 refills | Status: DC

## 2016-06-02 MED ORDER — AZITHROMYCIN 250 MG PO TABS: 250.0000 mg | ORAL_TABLET | Freq: Once | ORAL | 0 refills | Status: AC

## 2016-06-02 NOTE — Progress Notes (Addendum)
Subjective:    Patient ID: Dustin Weber, male    DOB: September 27, 1990, 25 y.o.   MRN: [id number]  HPI Dustin Weber is a 25 y.o. male Here for follow-up. Evaluated November 20 for cough, suspected early left lower lobe pneumonia with mild asthma flare. Started on azithromycin, ProAir HFA every 4 hours when necessary.   Feeling better. Accidentally threw away last pill.  Has taken 4 of 5 days Rx. No recent fever. Using albuterol 2-3 times per day over past few days and feels like requiring less of it yesterday. Has not used inhaler since last night.  Sleeping ok and eating/drinking ok.   Worked 2 days ago. Went ok.  Feels ready to RTW tonight.   Patient Active Problem List   Diagnosis Date Noted  . Asthma 07/08/2015   Past Medical History:  Diagnosis Date  . Asthma    No past surgical history on file. No Known Allergies Prior to Admission medications   Medication Sig Start Date End Date Taking? Authorizing Provider  albuterol (PROVENTIL HFA;VENTOLIN HFA) 108 (90 Base) MCG/ACT inhaler Inhale 1-2 puffs into the lungs every 4 (four) hours as needed for wheezing or shortness of breath. 05/29/16  Yes Shade FloodJeffrey R Carmelita Amparo, MD  azithromycin (ZITHROMAX) 250 MG tablet Take 2 pills by mouth on day 1, then 1 pill by mouth per day on days 2 through 5. Patient not taking: Reported on 06/02/2016 05/29/16   Shade FloodJeffrey R Tonga Prout, MD  Homeopathic Products [facility] ELDERBERRY COLD+FLU PO) Take by mouth.    Historical Provider, MD   Social History   Social History  . Marital status: Single    Spouse name: N/A  . Number of children: N/A  . Years of education: N/A   Occupational History  . Not on file.   Social History Main Topics  . Smoking status: Current Every Day Smoker    Packs/day: 0.20    Years: 3.00  . Smokeless tobacco: Never Used  . Alcohol use Yes     Comment: occasional  . Drug use: No  . Sexual activity: No   Other Topics Concern  . Not on file   Social History Narrative  . No  narrative on file      Review of Systems  Constitutional: Negative for appetite change, chills and fever.  Respiratory: Positive for cough, shortness of breath (minimal, improves with inhaler. ) and wheezing.        Objective:   Physical Exam  Constitutional: He is oriented to person, place, and time. He appears well-developed and well-nourished.  HENT:  Head: Normocephalic and atraumatic.  Right Ear: Tympanic membrane, external ear and ear canal normal.  Left Ear: Tympanic membrane, external ear and ear canal normal.  Nose: No rhinorrhea.  Mouth/Throat: Oropharynx is clear and moist and mucous membranes are normal. No oropharyngeal exudate or posterior oropharyngeal erythema.  Eyes: Conjunctivae are normal. Pupils are equal, round, and reactive to light.  Neck: Neck supple.  Cardiovascular: Normal rate, regular rhythm, normal heart sounds and intact distal pulses.   No murmur heard. Pulmonary/Chest: Effort normal. He has wheezes (Few scattered wheezes throughout. Possible minimal coarse breath sounds left lower. Good effort, no distress. Speaking in full sentences.) in the right lower field and the left lower field. He has no rhonchi. He has no rales.  Abdominal: Soft. There is no tenderness.  Lymphadenopathy:    He has no cervical adenopathy.  Neurological: He is alert and oriented to person, place, and time.  Skin: Skin is warm and dry. No rash noted.  Psychiatric: He has a normal mood and affect. His behavior is normal.  Vitals reviewed.  Vitals:   06/02/16 0849  BP: 120/80  Pulse: 69  Resp: 20  Temp: 98.5 F (36.9 C)  TempSrc: Oral  SpO2: 100%  Weight: 128 lb (58.1 kg)  Height: 5\' 7"  (1.702 m)      Assessment & Plan:    Dustin Weber is a 25 y.o. male Pneumonia of left lower lobe due to infectious organism (HCC) - Plan: azithromycin (ZITHROMAX) 250 MG tablet  Mild intermittent asthma with acute exacerbation  Improving. Afebrile. New prescription for last  dose of azithromycin as inadvertently threw away the last pill at home. Advised to use his albuterol this morning as wheezing noted on exam, but O2 sat normal and effort normal. Note given for work if still wheezing/short of breath tonight, and symptomatic care/RTC precautions discussed. If persistent need for albuterol into Monday, return for possible prednisone.  Meds ordered this encounter  Medications  . DISCONTD: azithromycin (ZITHROMAX) 250 MG tablet    Sig: Take 1 tablet (250 mg total) by mouth once. Take 2 pills by mouth on day 1, then 1 pill by mouth per day on days 2 through 5.    Dispense:  1 tablet    Refill:  0  . azithromycin (ZITHROMAX) 250 MG tablet    Sig: Take 1 tablet (250 mg total) by mouth once.    Dispense:  1 tablet    Refill:  0    Disregard prior prescription with multiple dosing instructions.   Patient Instructions   Use your albuterol inhaler every 4-6 hours as needed for tight cough/wheezing or shortness of breath. You do have some wheezing on exam this morning so use your inhaler. If you are still short of breath or wheezing tonight, I would recommend staying out of work. We have provided a note for you today and tomorrow if needed. If still requiring albuterol frequently, or more than 2-3 times per day into Monday, return to see me as prednisone may be needed. Take your last azithromycin today, and if any fevers or worsening symptoms, return for recheck here or the emergency room.  Return to the clinic or go to the nearest emergency room if any of your symptoms worsen or new symptoms occur.   Asthma, Acute Bronchospasm Acute bronchospasm caused by asthma is also referred to as an asthma attack. Bronchospasm means your air passages become narrowed. The narrowing is caused by inflammation and tightening of the muscles in the air tubes (bronchi) in your lungs. This can make it hard to breathe or cause you to wheeze and cough. What are the causes? Possible triggers  are:  Animal dander from the skin, hair, or feathers of animals.  Dust mites contained in house dust.  Cockroaches.  Pollen from trees or grass.  Mold.  Cigarette or tobacco smoke.  Air pollutants such as dust, household cleaners, hair sprays, aerosol sprays, paint fumes, strong chemicals, or strong odors.  Cold air or weather changes. Cold air may trigger inflammation. Winds increase molds and pollens in the air.  Strong emotions such as crying or laughing hard.  Stress.  Certain medicines such as aspirin or beta-blockers.  Sulfites in foods and drinks, such as dried fruits and wine.  Infections or inflammatory conditions, such as a flu, cold, or inflammation of the nasal membranes (rhinitis).  Gastroesophageal reflux disease (GERD). GERD is a condition where  stomach acid backs up into your esophagus.  Exercise or strenuous activity. What are the signs or symptoms?  Wheezing.  Excessive coughing, particularly at night.  Chest tightness.  Shortness of breath. How is this diagnosed? Your health care provider will ask you about your medical history and perform a physical exam. A chest X-ray or blood testing may be performed to look for other causes of your symptoms or other conditions that may have triggered your asthma attack. How is this treated? Treatment is aimed at reducing inflammation and opening up the airways in your lungs. Most asthma attacks are treated with inhaled medicines. These include quick relief or rescue medicines (such as bronchodilators) and controller medicines (such as inhaled corticosteroids). These medicines are sometimes given through an inhaler or a nebulizer. Systemic steroid medicine taken by mouth or given through an IV tube also can be used to reduce the inflammation when an attack is moderate or severe. Antibiotic medicines are only used if a bacterial infection is present. Follow these instructions at home:  Rest.  Drink plenty of  liquids. This helps the mucus to remain thin and be easily coughed up. Only use caffeine in moderation and do not use alcohol until you have recovered from your illness.  Do not smoke. Avoid being exposed to secondhand smoke.  You play a critical role in keeping yourself in good health. Avoid exposure to things that cause you to wheeze or to have breathing problems.  Keep your medicines up-to-date and available. Carefully follow your health care provider's treatment plan.  Take your medicine exactly as prescribed.  When pollen or pollution is bad, keep windows closed and use an air conditioner or go to places with air conditioning.  Asthma requires careful medical care. See your health care provider for a follow-up as advised. If you are more than [redacted] weeks pregnant and you were prescribed any new medicines, let your obstetrician know about the visit and how you are doing. Follow up with your health care provider as directed.  After you have recovered from your asthma attack, make an appointment with your outpatient doctor to talk about ways to reduce the likelihood of future attacks. If you do not have a doctor who manages your asthma, make an appointment with a primary care doctor to discuss your asthma. Get help right away if:  You are getting worse.  You have trouble breathing. If severe, call your local emergency services (911 in the [place]  You develop chest pain or discomfort.  You are vomiting.  You are not able to keep fluids down.  You are coughing up yellow, green, brown, or bloody sputum.  You have a fever and your symptoms suddenly get worse.  You have trouble swallowing. This information is not intended to replace advice given to you by your health care provider. Make sure you discuss any questions you have with your health care provider. Document Released: 10/11/2006 Document Revised: 12/08/2015 Document Reviewed: 01/01/2013 Elsevier Interactive Patient Education   2017 Elsevier Inc.  Community-Acquired Pneumonia, Adult Introduction Pneumonia is an infection of the lungs. One type of pneumonia can happen while a person is in a hospital. A different type can happen when a person is not in a hospital (community-acquired pneumonia). It is easy for this kind to spread from person to person. It can spread to you if you breathe near an infected person who coughs or sneezes. Some symptoms include:  A dry cough.  A wet (productive) cough.  Fever.  Sweating.  Chest pain. Follow these instructions at home:  Take over-the-counter and prescription medicines only as told by your doctor.  Only take cough medicine if you are losing sleep.  If you were prescribed an antibiotic medicine, take it as told by your doctor. Do not stop taking the antibiotic even if you start to feel better.  Sleep with your head and neck raised (elevated). You can do this by putting a few pillows under your head, or you can sleep in a recliner.  Do not use tobacco products. These include cigarettes, chewing tobacco, and e-cigarettes. If you need help quitting, ask your doctor.  Drink enough water to keep your pee (urine) clear or pale yellow. A shot (vaccine) can help prevent pneumonia. Shots are often suggested for:  People older than 23 years of age.  People older than 23 years of age:  Who are having cancer treatment.  Who have long-term (chronic) lung disease.  Who have problems with their body's defense system (immune system). You may also prevent pneumonia if you take these actions:  Get the flu (influenza) shot every year.  Go to the dentist as often as told.  Wash your hands often. If soap and water are not available, use hand sanitizer. Contact a doctor if:  You have a fever.  You lose sleep because your cough medicine does not help. Get help right away if:  You are short of breath and it gets worse.  You have more chest pain.  Your sickness gets  worse. This is very serious if:  You are an older adult.  Your body's defense system is weak.  You cough up blood. This information is not intended to replace advice given to you by your health care provider. Make sure you discuss any questions you have with your health care provider. Document Released: 12/13/2007 Document Revised: 12/02/2015 Document Reviewed: 10/21/2014  2017 Elsevier     IF you received an x-ray today, you will receive an invoice from [facility]. Please contact [facility] at [phone] with questions or concerns regarding your invoice.   IF you received labwork today, you will receive an invoice from [employer]. Please contact [facility] at [phone] with questions or concerns regarding your invoice.   Our billing staff will not be able to assist you with questions regarding bills from these companies.  You will be contacted with the lab results as soon as they are available. The fastest way to get your results is to activate your My Chart account. Instructions are located on the last page of this paperwork. If you have not heard from [place] regarding the results in 2 weeks, please contact this office.     Signed,   Meredith Staggers, MD [facility].  06/04/16 12:43 PM

## 2016-06-02 NOTE — Patient Instructions (Addendum)
Use your albuterol inhaler every 4-6 hours as needed for tight cough/wheezing or shortness of breath. You do have some wheezing on exam this morning so use your inhaler. If you are still short of breath or wheezing tonight, I would recommend staying out of work. We have provided a note for you today and tomorrow if needed. If still requiring albuterol frequently, or more than 2-3 times per day into Monday, return to see me as prednisone may be needed. Take your last azithromycin today, and if any fevers or worsening symptoms, return for recheck here or the emergency room.  Return to the clinic or go to the nearest emergency room if any of your symptoms worsen or new symptoms occur.   Asthma, Acute Bronchospasm Acute bronchospasm caused by asthma is also referred to as an asthma attack. Bronchospasm means your air passages become narrowed. The narrowing is caused by inflammation and tightening of the muscles in the air tubes (bronchi) in your lungs. This can make it hard to breathe or cause you to wheeze and cough. What are the causes? Possible triggers are:  Animal dander from the skin, hair, or feathers of animals.  Dust mites contained in house dust.  Cockroaches.  Pollen from trees or grass.  Mold.  Cigarette or tobacco smoke.  Air pollutants such as dust, household cleaners, hair sprays, aerosol sprays, paint fumes, strong chemicals, or strong odors.  Cold air or weather changes. Cold air may trigger inflammation. Winds increase molds and pollens in the air.  Strong emotions such as crying or laughing hard.  Stress.  Certain medicines such as aspirin or beta-blockers.  Sulfites in foods and drinks, such as dried fruits and wine.  Infections or inflammatory conditions, such as a flu, cold, or inflammation of the nasal membranes (rhinitis).  Gastroesophageal reflux disease (GERD). GERD is a condition where stomach acid backs up into your esophagus.  Exercise or strenuous  activity. What are the signs or symptoms?  Wheezing.  Excessive coughing, particularly at night.  Chest tightness.  Shortness of breath. How is this diagnosed? Your health care provider will ask you about your medical history and perform a physical exam. A chest X-ray or blood testing may be performed to look for other causes of your symptoms or other conditions that may have triggered your asthma attack. How is this treated? Treatment is aimed at reducing inflammation and opening up the airways in your lungs. Most asthma attacks are treated with inhaled medicines. These include quick relief or rescue medicines (such as bronchodilators) and controller medicines (such as inhaled corticosteroids). These medicines are sometimes given through an inhaler or a nebulizer. Systemic steroid medicine taken by mouth or given through an IV tube also can be used to reduce the inflammation when an attack is moderate or severe. Antibiotic medicines are only used if a bacterial infection is present. Follow these instructions at home:  Rest.  Drink plenty of liquids. This helps the mucus to remain thin and be easily coughed up. Only use caffeine in moderation and do not use alcohol until you have recovered from your illness.  Do not smoke. Avoid being exposed to secondhand smoke.  You play a critical role in keeping yourself in good health. Avoid exposure to things that cause you to wheeze or to have breathing problems.  Keep your medicines up-to-date and available. Carefully follow your health care provider's treatment plan.  Take your medicine exactly as prescribed.  When pollen or pollution is bad, keep windows closed and  use an air conditioner or go to places with air conditioning.  Asthma requires careful medical care. See your health care provider for a follow-up as advised. If you are more than [redacted] weeks pregnant and you were prescribed any new medicines, let your obstetrician know about the visit  and how you are doing. Follow up with your health care provider as directed.  After you have recovered from your asthma attack, make an appointment with your outpatient doctor to talk about ways to reduce the likelihood of future attacks. If you do not have a doctor who manages your asthma, make an appointment with a primary care doctor to discuss your asthma. Get help right away if:  You are getting worse.  You have trouble breathing. If severe, call your local emergency services (911 in the [place]  You develop chest pain or discomfort.  You are vomiting.  You are not able to keep fluids down.  You are coughing up yellow, green, brown, or bloody sputum.  You have a fever and your symptoms suddenly get worse.  You have trouble swallowing. This information is not intended to replace advice given to you by your health care provider. Make sure you discuss any questions you have with your health care provider. Document Released: 10/11/2006 Document Revised: 12/08/2015 Document Reviewed: 01/01/2013 Elsevier Interactive Patient Education  2017 Elsevier Inc.  Community-Acquired Pneumonia, Adult Introduction Pneumonia is an infection of the lungs. One type of pneumonia can happen while a person is in a hospital. A different type can happen when a person is not in a hospital (community-acquired pneumonia). It is easy for this kind to spread from person to person. It can spread to you if you breathe near an infected person who coughs or sneezes. Some symptoms include:  A dry cough.  A wet (productive) cough.  Fever.  Sweating.  Chest pain. Follow these instructions at home:  Take over-the-counter and prescription medicines only as told by your doctor.  Only take cough medicine if you are losing sleep.  If you were prescribed an antibiotic medicine, take it as told by your doctor. Do not stop taking the antibiotic even if you start to feel better.  Sleep with your head and neck  raised (elevated). You can do this by putting a few pillows under your head, or you can sleep in a recliner.  Do not use tobacco products. These include cigarettes, chewing tobacco, and e-cigarettes. If you need help quitting, ask your doctor.  Drink enough water to keep your pee (urine) clear or pale yellow. A shot (vaccine) can help prevent pneumonia. Shots are often suggested for:  People older than 23 years of age.  People older than 23 years of age:  Who are having cancer treatment.  Who have long-term (chronic) lung disease.  Who have problems with their body's defense system (immune system). You may also prevent pneumonia if you take these actions:  Get the flu (influenza) shot every year.  Go to the dentist as often as told.  Wash your hands often. If soap and water are not available, use hand sanitizer. Contact a doctor if:  You have a fever.  You lose sleep because your cough medicine does not help. Get help right away if:  You are short of breath and it gets worse.  You have more chest pain.  Your sickness gets worse. This is very serious if:  You are an older adult.  Your body's defense system is weak.  You cough up  blood. This information is not intended to replace advice given to you by your health care provider. Make sure you discuss any questions you have with your health care provider. Document Released: 12/13/2007 Document Revised: 12/02/2015 Document Reviewed: 10/21/2014  2017 Elsevier     IF you received an x-ray today, you will receive an invoice from [facility]. Please contact [facility] at [phone] with questions or concerns regarding your invoice.   IF you received labwork today, you will receive an invoice from [facility]. Please contact [facility] at [phone] with questions or concerns regarding your invoice.   Our billing staff will not be able to assist you with questions regarding  bills from these companies.  You will be contacted with the lab results as soon as they are available. The fastest way to get your results is to activate your My Chart account. Instructions are located on the last page of this paperwork. If you have not heard from us regarding the results in 2 weeks, please contact this office.

## 2016-06-05 ENCOUNTER — Ambulatory Visit (INDEPENDENT_AMBULATORY_CARE_PROVIDER_SITE_OTHER): Payer: BLUE CROSS/BLUE SHIELD | Admitting: Family Medicine

## 2016-06-05 VITALS — BP 120/74 | HR 80 | Temp 98.8°F | Resp 17 | Ht 67.0 in | Wt 126.0 lb

## 2016-06-05 DIAGNOSIS — Z720 Tobacco use: Secondary | ICD-10-CM

## 2016-06-05 DIAGNOSIS — R059 Cough, unspecified: Secondary | ICD-10-CM

## 2016-06-05 DIAGNOSIS — J4521 Mild intermittent asthma with (acute) exacerbation: Secondary | ICD-10-CM | POA: Diagnosis not present

## 2016-06-05 DIAGNOSIS — J181 Lobar pneumonia, unspecified organism: Secondary | ICD-10-CM

## 2016-06-05 DIAGNOSIS — R05 Cough: Secondary | ICD-10-CM | POA: Diagnosis not present

## 2016-06-05 DIAGNOSIS — J189 Pneumonia, unspecified organism: Secondary | ICD-10-CM

## 2016-06-05 MED ORDER — PREDNISONE 20 MG PO TABS: 40.0000 mg | ORAL_TABLET | Freq: Every day | ORAL | 0 refills | Status: DC

## 2016-06-05 MED ORDER — BENZONATATE 100 MG PO CAPS: 100.0000 mg | ORAL_CAPSULE | Freq: Three times a day (TID) | ORAL | 0 refills | Status: DC | PRN

## 2016-06-05 MED ORDER — PREDNISONE 20 MG PO TABS: 20.0000 mg | ORAL_TABLET | Freq: Every day | ORAL | 0 refills | Status: DC

## 2016-06-05 NOTE — Patient Instructions (Addendum)
  Even though your peak flow is not quite normal, your lungs are clear on exam today. Okay to continue the albuterol up to every 4 hours as needed, but if you use that more than once or twice today, start prednisone.  At this point you likely will not need a nebulizer as the prednisone should cause wheezing to calm down. Tessalon Perles if needed for cough, or over-the-counter Mucinex DM. As we discussed if the Mucinex DM causes more wheezing, stop it right away.  If you have shortness of breath, fever, or worsening symptoms in the next few days, return for recheck, otherwise cough should continue to improve.   Return to the clinic or go to the nearest emergency room if any of your symptoms worsen or new symptoms occur.    IF you received an x-ray today, you will receive an invoice from [facility]. Please contact [facility] at [phone] with questions or concerns regarding your invoice.   IF you received labwork today, you will receive an invoice from [facility]. Please contact [facility] at [phone] with questions or concerns regarding your invoice.   Our billing staff will not be able to assist you with questions regarding bills from these companies.  You will be contacted with the lab results as soon as they are available. The fastest way to get your results is to activate your My Chart account. Instructions are located on the last page of this paperwork. If you have not heard from us regarding the results in 2 weeks, please contact this office.

## 2016-06-05 NOTE — Progress Notes (Signed)
Subjective:  By signing my name below, I, Dustin Weber, attest that this documentation has been prepared under the direction and in the presence of Dustin FloodJeffrey R Monserrat Vidaurri, MD Electronically Signed: Charline BillsEssence Weber, ED Scribe 06/05/2016 at 8:39 AM.   Patient ID: Dustin Weber, male    DOB: 04/15/1991, 25 y.o.   MRN: [id number]  Chief Complaint  Patient presents with  . Follow-up    Pneumonia of left lower lobe due to infectious organism. Pt states he still has cough.    HPI  HPI Comments: Dustin Weber is a 25 y.o. male who presents to the [facility] for a follow-up of pneumonia. initially diagnosed on 11/20. Started on azithromycin z-pak and Proventil inhaler every 4 hours PRN asthma symptoms. Initially was given a neb in the office. Recheck 3 days ago, improving using albuterol 2-3 times a day. No recent fever.   Today, pt states that he is feeling better but still has productive cough with clear mucus. He reports mild sob only with coughing spells and states that cough occasionally keeps him up at night but sometimes he is able to sleep. Pt returned to work yesterday but states that it was cold in his work truck which he states worsened his cough. He reports using his inhaler 4 times yesterday. Pt has completed the azithromycin. He denies wheezing or fever. Pt has not smoked since he has been ill. Pt has taken Prednisone in the past for bronchitis without complications.   Patient Active Problem List   Diagnosis Date Noted  . Asthma 07/08/2015   Past Medical History:  Diagnosis Date  . Asthma    No past surgical history on file. No Known Allergies Prior to Admission medications   Medication Sig Start Date End Date Taking? Authorizing Provider  albuterol (PROVENTIL HFA;VENTOLIN HFA) 108 (90 Base) MCG/ACT inhaler Inhale 1-2 puffs into the lungs every 4 (four) hours as needed for wheezing or shortness of breath. 05/29/16  Yes Dustin FloodJeffrey R Elson Ulbrich, MD  Homeopathic Products  [facility] ELDERBERRY COLD+FLU PO) Take by mouth.   Yes Historical Provider, MD   Social History   Social History  . Marital status: Single    Spouse name: N/A  . Number of children: N/A  . Years of education: N/A   Occupational History  . Not on file.   Social History Main Topics  . Smoking status: Current Every Day Smoker    Packs/day: 0.20    Years: 3.00  . Smokeless tobacco: Never Used  . Alcohol use Yes     Comment: occasional  . Drug use: No  . Sexual activity: No   Other Topics Concern  . Not on file   Social History Narrative  . No narrative on file   Review of Systems  Constitutional: Negative for fever.  Respiratory: Positive for cough. Negative for wheezing.    Pulse 80   Temp 98.8 F (37.1 C) (Oral)   Resp 17   Ht 5\' 7"  (1.702 m)   Wt 126 lb (57.2 kg)   SpO2 100%   BMI 19.73 kg/m    Peak flow reading is 450, about 74 % of predicted.     Objective:   Physical Exam  Constitutional: He is oriented to person, place, and time. He appears well-developed and well-nourished.  HENT:  Head: Normocephalic and atraumatic.  Right Ear: Tympanic membrane, external ear and ear canal normal.  Left Ear: Tympanic membrane, external ear and ear canal normal.  Nose:  No rhinorrhea.  Mouth/Throat: Oropharynx is clear and moist and mucous membranes are normal. No oropharyngeal exudate or posterior oropharyngeal erythema.  Eyes: Conjunctivae are normal. Pupils are equal, round, and reactive to light.  Neck: Neck supple.  Cardiovascular: Normal rate, regular rhythm, normal heart sounds and intact distal pulses.   No murmur heard. Pulmonary/Chest: Effort normal and breath sounds normal. He has no wheezes. He has no rhonchi. He has no rales.  Abdominal: Soft. There is no tenderness.  Lymphadenopathy:    He has no cervical adenopathy.  Neurological: He is alert and oriented to person, place, and time.  Skin: Skin is warm and dry. No rash noted.  Psychiatric: He has a  normal mood and affect. His behavior is normal.  Vitals reviewed.     Assessment & Plan:   Dustin Weber is a 25 y.o. male Mild intermittent asthma with acute exacerbation - Plan: predniSONE (DELTASONE) 20 MG tablet, DISCONTINUED: predniSONE (DELTASONE) 20 MG tablet  Community acquired pneumonia of left lower lobe of lung (HCC)  Cough - Plan: benzonatate (TESSALON) 100 MG capsule, predniSONE (DELTASONE) 20 MG tablet, DISCONTINUED: predniSONE (DELTASONE) 20 MG tablet  Community acquired pneumonia, improving, O2 sat in temp reassuring, exam reassuring with clear lungs on exam. Still with some persistent cough, and frequent use of albuterol yesterday with asthma exacerbation/flare from pneumonia. Borderline low peak flow in the office, but again lungs were clear, normal effort.  -Start prednisone 40 mg daily for 5 days today if persistent wheeze or use of albuterol more than once or twice today.  -Tessalon Perles 3 times a day when necessary  -Mucinex DM over-the-counter. Stop if increased wheeze.  -advised to continue to avoid cigarettes both for him and his daughter's health. Advised let me know if he needs assistance.  RTC precautions given.  Meds ordered this encounter  Medications  . DISCONTD: predniSONE (DELTASONE) 20 MG tablet    Sig: Take 1 tablet (20 mg total) by mouth daily with breakfast.    Dispense:  10 tablet    Refill:  0  . benzonatate (TESSALON) 100 MG capsule    Sig: Take 1 capsule (100 mg total) by mouth 3 (three) times daily as needed for cough.    Dispense:  20 capsule    Refill:  0  . predniSONE (DELTASONE) 20 MG tablet    Sig: Take 2 tablets (40 mg total) by mouth daily with breakfast.    Dispense:  10 tablet    Refill:  0   Patient Instructions    Even though your peak flow is not quite normal, your lungs are clear on exam today. Okay to continue the albuterol up to every 4 hours as needed, but if you use that more than once or twice today, start  prednisone.  At this point you likely will not need a nebulizer as the prednisone should cause wheezing to calm down. Tessalon Perles if needed for cough, or over-the-counter Mucinex DM. As we discussed if the Mucinex DM causes more wheezing, stop it right away.  If you have shortness of breath, fever, or worsening symptoms in the next few days, return for recheck, otherwise cough should continue to improve.   Return to the clinic or go to the nearest emergency room if any of your symptoms worsen or new symptoms occur.    IF you received an x-ray today, you will receive an invoice from [facility]. Please contact [facility] at [phone] with questions or concerns regarding your invoice.  IF you received labwork today, you will receive an invoice from [facility]. Please contact [facility] at [phone] with questions or concerns regarding your invoice.   Our billing staff will not be able to assist you with questions regarding bills from these companies.  You will be contacted with the lab results as soon as they are available. The fastest way to get your results is to activate your My Chart account. Instructions are located on the last page of this paperwork. If you have not heard from us regarding the results in 2 weeks, please contact this office.        I personally performed the services described in this documentation, which was scribed in my presence. The recorded information has been reviewed and considered, and addended by me as needed.   Signed,   Dustin StaggersJeffrey Jamara Vary, MD [facility].  06/05/16 9:04 AM

## 2016-08-29 ENCOUNTER — Ambulatory Visit (INDEPENDENT_AMBULATORY_CARE_PROVIDER_SITE_OTHER): Payer: BLUE CROSS/BLUE SHIELD | Admitting: Physician Assistant

## 2016-08-29 VITALS — BP 120/68 | HR 83 | Temp 98.1°F | Resp 18 | Ht 67.0 in | Wt 134.8 lb

## 2016-08-29 DIAGNOSIS — M546 Pain in thoracic spine: Secondary | ICD-10-CM | POA: Diagnosis not present

## 2016-08-29 DIAGNOSIS — M62830 Muscle spasm of back: Secondary | ICD-10-CM | POA: Diagnosis not present

## 2016-08-29 MED ORDER — MELOXICAM 7.5 MG PO TABS: 7.5000 mg | ORAL_TABLET | Freq: Every day | ORAL | 0 refills | Status: DC

## 2016-08-29 MED ORDER — CYCLOBENZAPRINE HCL 5 MG PO TABS: 5.0000 mg | ORAL_TABLET | Freq: Three times a day (TID) | ORAL | 0 refills | Status: DC | PRN

## 2016-08-29 NOTE — Patient Instructions (Addendum)
I recommend resting today. However, tomorrow I would begin walking and moving around as much as tolerated. Begin stretching in a couple of days. The worse thing you can do for low back pain is lie in bed all day or sit down all day. Use medications as needed.   Just to know, flexeril can cause side effects that may impair your thinking or reactions. Be careful if you drive or do anything that requires you to be awake and alert. void drinking alcohol, which can increase some of the side effects of Flexeril.  NSAIDs like meloxicam have common side effects of heartburn, stomach pain, indigestion, and headache. Could lead to renal insufficiency, stroke, or GI bleed if taken excess amounts outside of what is recommended on label long term.    You should avoid heavy lifting or strenuous repetitive activity to prevent recurrence of event. Experiment with both ice and heat and choose whichever feels best for you.  Use heat pad or ice pack, do not apply directly to skin, use barrier such as towel over the skin. Leave on for 15-20 minutes, 3-4 times a day.  Please perform exercises below. Stretches are to be performed for 2 sets, holding 10-15 seconds each. Recommended to perform this rehab twice daily within pain tolerance for 2 weeks.  Return to clinic if no improvement in one week.    FLEXION RANGE OF MOTION AND STRETCHING EXERCISES: STRETCH - Flexion, Single Knee to Chest   Lie on a firm bed or floor with both legs extended in front of you.  Keeping one leg in contact with the floor, bring your opposite knee to your chest. Hold your leg in place by either grabbing behind your thigh or at your knee.  Pull until you feel a gentle stretch in your lower back.   Slowly release your grasp and repeat the exercise with the opposite side.  STRETCH - Flexion, Double Knee to Chest   Lie on a firm bed or floor with both legs extended in front of you.  Keeping one leg in contact with the floor, bring your  opposite knee to your chest.  Tense your stomach muscles to support your back and then lift your other knee to your chest. Hold your legs in place by either grabbing behind your thighs or at your knees.  Pull both knees toward your chest until you feel a gentle stretch in your lower back.   Tense your stomach muscles and slowly return one leg at a time to the floor.  STRETCH - Low Trunk Rotation  Lie on a firm bed or floor. Keeping your legs in front of you, bend your knees so they are both pointed toward the ceiling and your feet are flat on the floor.  Extend your arms out to the side. This will stabilize your upper body by keeping your shoulders in contact with the floor.  Gently and slowly drop both knees together to one side until you feel a gentle stretch in your lower back.   Tense your stomach muscles to support your lower back as you bring your knees back to the starting position. Repeat the exercise to the other side.   EXTENSION RANGE OF MOTION AND FLEXIBILITY EXERCISES: STRETCH - Extension, Prone on Elbows   Lie on your stomach on the floor, a bed will be too soft. Place your palms about shoulder width apart and at the height of your head.  Place your elbows under your shoulders. If this is too painful,  stack pillows under your chest.  Allow your body to relax so that your hips drop lower and make contact more completely with the floor.  Slowly return to lying flat on the floor.  RANGE OF MOTION - Extension, Prone Press Ups  Lie on your stomach on the floor, a bed will be too soft. Place your palms about shoulder width apart and at the height of your head.  Keeping your back as relaxed as possible, slowly straighten your elbows while keeping your hips on the floor. You may adjust the placement of your hands to maximize your comfort. As you gain motion, your hands will come more underneath your shoulders.  Slowly return to lying flat on the floor.  RANGE OF MOTION-  Quadruped, Neutral Spine   Assume a hands and knees position on a firm surface. Keep your hands under your shoulders and your knees under your hips. You may place padding under your knees for comfort.  Drop your head and point your tail bone toward the ground below you. This will round out your lower back like an angry cat.    Slowly lift your head and release your tail bone so that your back sags into a large arch, like an old horse.  Repeat this until you feel limber in your lower back.  Now, find your "sweet spot." This will be the most comfortable position somewhere between the two previous positions. This is your neutral spine. Once you have found this position, tense your stomach muscles to support your lower back.  STRENGTHENING EXERCISES - Low Back Strain These exercises may help you when beginning to rehabilitate your injury. These exercises should be done near your "sweet spot." This is the neutral, low-back arch, somewhere between fully rounded and fully arched, that is your least painful position. When performed in this safe range of motion, these exercises can be used for people who have either a flexion or extension based injury. These exercises may resolve your symptoms with or without further involvement from your physician, physical therapist or athletic trainer. While completing these exercises, remember:   Muscles can gain both the endurance and the strength needed for everyday activities through controlled exercises.  Complete these exercises as instructed by your physician, physical therapist or athletic trainer. Increase the resistance and repetitions only as guided.  You may experience muscle soreness or fatigue, but the pain or discomfort you are trying to eliminate should never worsen during these exercises. If this pain does worsen, stop and make certain you are following the directions exactly. If the pain is still present after adjustments, discontinue the exercise  until you can discuss the trouble with your caregiver.  STRENGTHENING - Deep Abdominals, Pelvic Tilt  Lie on a firm bed or floor. Keeping your legs in front of you, bend your knees so they are both pointed toward the ceiling and your feet are flat on the floor.  Tense your lower abdominal muscles to press your lower back into the floor. This motion will rotate your pelvis so that your tail bone is scooping upwards rather than pointing at your feet or into the floor.  STRENGTHENING - Abdominals, Crunches   Lie on a firm bed or floor. Keeping your legs in front of you, bend your knees so they are both pointed toward the ceiling and your feet are flat on the floor. Cross your arms over your chest.  Slightly tip your chin down without bending your neck.  Tense your abdominals and slowly lift  your trunk high enough to just clear your shoulder blades. Lifting higher can put excessive stress on the lower back and does not further strengthen your abdominal muscles.  Control your return to the starting position.  STRENGTHENING - Quadruped, Opposite UE/LE Lift   Assume a hands and knees position on a firm surface. Keep your hands under your shoulders and your knees under your hips. You may place padding under your knees for comfort.  Find your neutral spine and gently tense your abdominal muscles so that you can maintain this position. Your shoulders and hips should form a rectangle that is parallel with the floor and is not twisted.  Keeping your trunk steady, lift your right hand no higher than your shoulder and then your left leg no higher than your hip. Make sure you are not holding your breath.   Continuing to keep your abdominal muscles tense and your back steady, slowly return to your starting position. Repeat with the opposite arm and leg.  STRENGTHENING - Lower Abdominals, Double Knee Lift  Lie on a firm bed or floor. Keeping your legs in front of you, bend your knees so they are both  pointed toward the ceiling and your feet are flat on the floor.  Tense your abdominal muscles to brace your lower back and slowly lift both of your knees until they come over your hips. Be certain not to hold your breath.  POSTURE AND BODY MECHANICS CONSIDERATIONS - Low Back Strain Keeping correct posture when sitting, standing or completing your activities will reduce the stress put on different body tissues, allowing injured tissues a chance to heal and limiting painful experiences. The following are general guidelines for improved posture. Your physician or physical therapist will provide you with any instructions specific to your needs. While reading these guidelines, remember:  The exercises prescribed by your provider will help you have the flexibility and strength to maintain correct postures.  The correct posture provides the best environment for your joints to work. All of your joints have less wear and tear when properly supported by a spine with good posture. This means you will experience a healthier, less painful body.  Correct posture must be practiced with all of your activities, especially prolonged sitting and standing. Correct posture is as important when doing repetitive low-stress activities (typing) as it is when doing a single heavy-load activity (lifting). RESTING POSITIONS Consider which positions are most painful for you when choosing a resting position. If you have pain with flexion-based activities (sitting, bending, stooping, squatting), choose a position that allows you to rest in a less flexed posture. You would want to avoid curling into a fetal position on your side. If your pain worsens with extension-based activities (prolonged standing, working overhead), avoid resting in an extended position such as sleeping on your stomach. Most people will find more comfort when they rest with their spine in a more neutral position, neither too rounded nor too arched. Lying on a  non-sagging bed on your side with a pillow between your knees, or on your back with a pillow under your knees will often provide some relief. Keep in mind, being in any one position for a prolonged period of time, no matter how correct your posture, can still lead to stiffness. PROPER SITTING POSTURE In order to minimize stress and discomfort on your spine, you must sit with correct posture. Sitting with good posture should be effortless for a healthy body. Returning to good posture is a gradual process. Many  people can work toward this most comfortably by using various supports until they have the flexibility and strength to maintain this posture on their own. When sitting with proper posture, your ears will fall over your shoulders and your shoulders will fall over your hips. You should use the back of the chair to support your upper back. Your lower back will be in a neutral position, just slightly arched. You may place a small pillow or folded towel at the base of your lower back for support.  When working at a desk, create an environment that supports good, upright posture. Without extra support, muscles tire, which leads to excessive strain on joints and other tissues. Keep these recommendations in mind: CHAIR:  A chair should be able to slide under your desk when your back makes contact with the back of the chair. This allows you to work closely.  The chair's height should allow your eyes to be level with the upper part of your monitor and your hands to be slightly lower than your elbows. BODY POSITION  Your feet should make contact with the floor. If this is not possible, use a foot rest.  Keep your ears over your shoulders. This will reduce stress on your neck and lower back. INCORRECT SITTING POSTURES  If you are feeling tired and unable to assume a healthy sitting posture, do not slouch or slump. This puts excessive strain on your back tissues, causing more damage and pain. Healthier  options include:  Using more support, like a lumbar pillow.  Switching tasks to something that requires you to be upright or walking.  Talking a brief walk.  Lying down to rest in a neutral-spine position. PROLONGED STANDING WHILE SLIGHTLY LEANING FORWARD  When completing a task that requires you to lean forward while standing in one place for a long time, place either foot up on a stationary 2-4 inch high object to help maintain the best posture. When both feet are on the ground, the lower back tends to lose its slight inward curve. If this curve flattens (or becomes too large), then the back and your other joints will experience too much stress, tire more quickly, and can cause pain. CORRECT STANDING POSTURES Proper standing posture should be assumed with all daily activities, even if they only take a few moments, like when brushing your teeth. As in sitting, your ears should fall over your shoulders and your shoulders should fall over your hips. You should keep a slight tension in your abdominal muscles to brace your spine. Your tailbone should point down to the ground, not behind your body, resulting in an over-extended swayback posture.  INCORRECT STANDING POSTURES  Common incorrect standing postures include a forward head, locked knees and/or an excessive swayback. WALKING Walk with an upright posture. Your ears, shoulders and hips should all line-up. PROLONGED ACTIVITY IN A FLEXED POSITION When completing a task that requires you to bend forward at your waist or lean over a low surface, try to find a way to stabilize 3 out of 4 of your limbs. You can place a hand or elbow on your thigh or rest a knee on the surface you are reaching across. This will provide you more stability so that your muscles do not fatigue as quickly. By keeping your knees relaxed, or slightly bent, you will also reduce stress across your lower back. CORRECT LIFTING TECHNIQUES DO :   Assume a wide stance. This will  provide you more stability and the opportunity to  get as close as possible to the object which you are lifting.  Tense your abdominals to brace your spine. Bend at the knees and hips. Keeping your back locked in a neutral-spine position, lift using your leg muscles. Lift with your legs, keeping your back straight.  Test the weight of unknown objects before attempting to lift them.  Try to keep your elbows locked down at your sides in order get the best strength from your shoulders when carrying an object.  Always ask for help when lifting heavy or awkward objects. INCORRECT LIFTING TECHNIQUES DO NOT:   Lock your knees when lifting, even if it is a small object.  Bend and twist. Pivot at your feet or move your feet when needing to change directions.  Assume that you can safely pick up even a paper clip without proper posture.        IF you received an x-ray today, you will receive an invoice from [facility]. Please contact [facility] at [phone] with questions or concerns regarding your invoice.   IF you received labwork today, you will receive an invoice from [facility]. Please contact [facility] at [phone] with questions or concerns regarding your invoice.   Our billing staff will not be able to assist you with questions regarding bills from these companies.  You will be contacted with the lab results as soon as they are available. The fastest way to get your results is to activate your My Chart account. Instructions are located on the last page of this paperwork. If you have not heard from [place] regarding the results in 2 weeks, please contact this office.

## 2016-08-29 NOTE — Progress Notes (Signed)
   Dustin Weber  MRN: [id number] DOB: September 07, 1990  Subjective:  Dustin Weber is a 26 y.o. male seen in office today for a chief complaint of back pain x 4 days. He was picking up his daughter on Saturday and immediately noticed the pain. He has tried a heating pad with moderate relief. Notes twisting makes its worse. Denies acute trauma, bladder/bowel incontinence, saddle anesthesia, and hematuria. Of note, pt works at [employer] in [place].   Review of Systems  Constitutional: Negative for chills, diaphoresis and fever.    Patient Active Problem List   Diagnosis Date Noted  . Asthma 07/08/2015    Current Outpatient Prescriptions on File Prior to Visit  Medication Sig Dispense Refill  . albuterol (PROVENTIL HFA;VENTOLIN HFA) 108 (90 Base) MCG/ACT inhaler Inhale 1-2 puffs into the lungs every 4 (four) hours as needed for wheezing or shortness of breath. (Patient not taking: Reported on 08/29/2016) 1 Inhaler 0  . Homeopathic Products (UMCKA ELDERBERRY COLD+FLU PO) Take by mouth.     No current facility-administered medications on file prior to visit.     No Known Allergies   Objective:  BP 120/68 (BP Location: Right Arm, Patient Position: Sitting, Cuff Size: Small)   Pulse 83   Temp 98.1 F (36.7 C) (Oral)   Resp 18   Ht 5\' 7"  (1.702 m)   Wt 134 lb 12.8 oz (61.1 kg)   SpO2 99%   BMI 21.11 kg/m   Physical Exam  Constitutional: He is oriented to person, place, and time and well-developed, well-nourished, and in no distress.  HENT:  Head: Normocephalic and atraumatic.  Eyes: Conjunctivae are normal.  Neck: Normal range of motion.  Pulmonary/Chest: Effort normal.  Musculoskeletal:       Thoracic back: He exhibits tenderness (with palpation of right sided musculature ) and spasm (of right sided musculature). He exhibits normal range of motion.  Neurological: He is alert and oriented to person, place, and time. He has normal strength. He has a normal Straight Leg Raise Test and a  normal Tandem Gait Test. Gait normal. Gait normal.  Reflex Scores:      Patellar reflexes are 2+ on the right side and 2+ on the left side.      Achilles reflexes are 2+ on the right side and 2+ on the left side. Skin: Skin is warm and dry.  Psychiatric: Affect normal.  Vitals reviewed.  Assessment and Plan :  1. Acute right-sided thoracic back pain -History and PE consistent with strained muscle. Will treat accordingly with heating pad, NSAIDs, rest, and stretching once pain resolves. Pt instructed to return to clinic if symptoms worsen, do not improve in 7-10 days, or as needed - meloxicam (MOBIC) 7.5 MG tablet; Take 1 tablet (7.5 mg total) by mouth daily.  Dispense: 30 tablet; Refill: 0 2. Back spasm - cyclobenzaprine (FLEXERIL) 5 MG tablet; Take 1 tablet (5 mg total) by mouth 3 (three) times daily as needed for muscle spasms.  Dispense: 60 tablet; Refill: 0   Benjiman CoreBrittany Thad Osoria PA-C  [facility] 08/29/2016 12:49 PM

## 2016-10-21 ENCOUNTER — Ambulatory Visit: Payer: BLUE CROSS/BLUE SHIELD

## 2016-10-23 ENCOUNTER — Ambulatory Visit (INDEPENDENT_AMBULATORY_CARE_PROVIDER_SITE_OTHER): Payer: BLUE CROSS/BLUE SHIELD | Admitting: Urgent Care

## 2016-10-23 VITALS — BP 142/80 | HR 105 | Temp 97.6°F | Ht 67.0 in | Wt 132.8 lb

## 2016-10-23 DIAGNOSIS — M79602 Pain in left arm: Secondary | ICD-10-CM | POA: Diagnosis not present

## 2016-10-23 DIAGNOSIS — M778 Other enthesopathies, not elsewhere classified: Secondary | ICD-10-CM

## 2016-10-23 DIAGNOSIS — M62838 Other muscle spasm: Secondary | ICD-10-CM | POA: Diagnosis not present

## 2016-10-23 DIAGNOSIS — M779 Enthesopathy, unspecified: Secondary | ICD-10-CM

## 2016-10-23 DIAGNOSIS — M79601 Pain in right arm: Secondary | ICD-10-CM | POA: Diagnosis not present

## 2016-10-23 MED ORDER — MELOXICAM 7.5 MG PO TABS: 7.5000 mg | ORAL_TABLET | Freq: Every day | ORAL | 0 refills | Status: DC

## 2016-10-23 MED ORDER — CYCLOBENZAPRINE HCL 5 MG PO TABS: 5.0000 mg | ORAL_TABLET | Freq: Three times a day (TID) | ORAL | 0 refills | Status: DC | PRN

## 2016-10-23 NOTE — Patient Instructions (Addendum)
Start taking meloxicam  once daily (2 pills of 7.5mg ) for the next 2 days. Then switch to just 7.5mg  daily. Increase it back up if your pain is still there. Take Flexeril 3 times daily if you do not experience drowsiness. Otherwise, take  nightly. Make sure you drink 1 gallon of water daily, that's 2 liters of water.    Tendinitis Tendinitis is inflammation of a tendon. A tendon is a strong cord of tissue that connects muscle to bone. Tendinitis can affect any tendon, but it most commonly affects the shoulder tendon (rotator cuff), ankle tendon (Achilles tendon), elbow tendon (triceps tendon), or one of the tendons in the wrist. What are the causes? This condition may be caused by:  Overusing a tendon or muscle. This is common.  Age-related wear and tear.  Injury.  Inflammatory conditions, such as arthritis.  Certain medicines. What increases the risk? This condition is more likely to develop in people who do activities that involve repetitive motions. What are the signs or symptoms? Symptoms of this condition may include:  Pain.  Tenderness.  Mild swelling. How is this diagnosed? This condition is diagnosed with a physical exam. You may also have tests, such as:  Ultrasound. This uses sound waves to make an image of your affected area.  MRI. How is this treated? This condition may be treated by resting, icing, applying pressure (compression), and raising (elevating) the area above the level of your heart. This is known as RICE therapy. Treatment may also include:  Medicines to help reduce inflammation or to help reduce pain.  Exercises or physical therapy to strengthen and stretch the tendon.  A brace or splint.  Surgery (rare). Follow these instructions at home:   If you have a splint or brace:   Wear the splint or brace as told by your health care provider. Remove it only as told by your health care provider.  Loosen the splint or brace if your fingers or  toes tingle, become numb, or turn cold and blue.  Do not take baths, swim, or use a hot tub until your health care provider approves. Ask your health care provider if you can take showers. You may only be allowed to take sponge baths for bathing.  Do not let your splint or brace get wet if it is not waterproof.  If your splint or brace is not waterproof, cover it with a watertight plastic bag when you take a bath or a shower.  Keep the splint or brace clean. Managing pain, stiffness, and swelling   If directed, apply ice to the affected area.  Put ice in a plastic bag.  Place a towel between your skin and the bag.  Leave the ice on for 20 minutes, 2-3 times a day.  If directed, apply heat to the affected area as often as told by your health care provider. Use the heat source that your health care provider recommends, such as a moist heat pack or a heating pad.  Place a towel between your skin and the heat source.  Leave the heat on for 20-30 minutes.  Remove the heat if your skin turns bright red. This is especially important if you are unable to feel pain, heat, or cold. You may have a greater risk of getting burned.  Move the fingers or toes of the affected limb often, if this applies. This can help to prevent stiffness and lessen swelling.  If directed, elevate the affected area above the level of your  heart while you are sitting or lying down. Driving   Do not drive or operate heavy machinery while taking prescription pain medicine.  Ask your health care provider when it is safe to drive if you have a splint or brace on any part of your arm or leg. Activity   Return to your normal activities as told by your health care provider. Ask your health care provider what activities are safe for you.  Rest the affected area as told by your health care provider.  Avoid using the affected area while you are experiencing symptoms of tendinitis.  Do exercises as told by your health  care provider. General instructions   If you have a splint, do not put pressure on any part of the splint until it is fully hardened. This may take several hours.  Wear an elastic bandage or compression wrap only as told by your health care provider.  Take over-the-counter and prescription medicines only as told by your health care provider.  Keep all follow-up visits as told by your health care provider. This is important. Contact a health care provider if:  Your symptoms do not improve.  You develop new, unexplained problems, such as numbness in your hands. This information is not intended to replace advice given to you by your health care provider. Make sure you discuss any questions you have with your health care provider. Document Released: 06/23/2000 Document Revised: 02/24/2016 Document Reviewed: 03/29/2015 Elsevier Interactive Patient Education  2017 [employer].     IF you received an x-ray today, you will receive an invoice from [facility]. Please contact [facility] at [phone] with questions or concerns regarding your invoice.   IF you received labwork today, you will receive an invoice from [place]. Please contact [facility] at [phone] with questions or concerns regarding your invoice.   Our billing staff will not be able to assist you with questions regarding bills from these companies.  You will be contacted with the lab results as soon as they are available. The fastest way to get your results is to activate your My Chart account. Instructions are located on the last page of this paperwork. If you have not heard from [place] regarding the results in 2 weeks, please contact this office.

## 2016-10-23 NOTE — Progress Notes (Signed)
  MRN: [id number] DOB: 1990/09/18  Subjective:   Dustin Weber is a 26 y.o. male presenting for chief complaint of Arm Pain (X 4 days both arm pain)  Reports 4 day history of bilateral arm pain. Pain is throbbing sensation, constant, located over upper arms, worsened with rotating arms, flexing them. Has had difficulty with lifting items. Has been using Biofreeze, icing, Motrin, stretching with minimal relief. Patient was exercising strenuously Wednesday, did a lot of push ups. Hydrates ~5 bottles of water daily. Denies fever, redness, swelling, bony pain. Has not used previously prescribed meloxicam, Flexeril.   Dustin Weber has a current medication list which includes the following prescription(s): albuterol, cyclobenzaprine, and meloxicam. Also has No Known Allergies. Dustin Weber  has a past medical history of Asthma. Also denies surgical history.   Objective:   Vitals: BP (!) 142/80 (BP Location: Right Arm, Patient Position: Sitting, Cuff Size: Small)   Pulse (!) 105   Temp 97.6 F (36.4 C) (Oral)   Ht  (1.702 m)   Wt 132 lb 12.8 oz (60.2 kg)   SpO2 100%   BMI 20.80 kg/m   Physical Exam  Constitutional: He is oriented to person, place, and time. He appears well-developed and well-nourished.  Cardiovascular: Normal rate.   Pulmonary/Chest: Effort normal.  Musculoskeletal:       Right shoulder: He exhibits normal range of motion, no tenderness, no bony tenderness, no swelling, no effusion, no crepitus, no deformity, no laceration, no spasm and normal strength.       Right upper arm: He exhibits tenderness (over distal triceps and triceps tendon with associated decreased flexion of arm). He exhibits no bony tenderness, no swelling, no edema, no deformity and no laceration.       Left upper arm: He exhibits tenderness (over distal triceps and triceps tendon with associated decreased flexion of arm). He exhibits no bony tenderness, no swelling, no edema, no deformity and no laceration.    Neurological: He is alert and oriented to person, place, and time.   Assessment and Plan :   1. Bilateral arm pain 2. Triceps tendinitis 3. Muscle spasm - Labs pending, will start conservative management. Advised aggressive hydration and rest. Work restrictions provided. Refilled Flexeril and meloxicam. Recheck in 1 week.  Wallis Bamberg, PA-C [facility] [phone] 10/23/2016  8:53 AM

## 2016-10-24 LAB — SEDIMENTATION RATE: Sed Rate: 4 mm/hr (ref 0–15)

## 2016-10-24 LAB — BASIC METABOLIC PANEL
BUN/Creatinine Ratio: 15 (ref 9–20)
BUN: 13 mg/dL (ref 6–20)
CO2: 27 mmol/L (ref 18–29)
Calcium: 10.2 mg/dL (ref 8.7–10.2)
Chloride: 99 mmol/L (ref 96–106)
Creatinine, Ser: 0.88 mg/dL (ref 0.76–1.27)
GFR calc Af Amer: 140 mL/min/{1.73_m2} (ref >59)
GFR calc non Af Amer: 121 mL/min/{1.73_m2} (ref >59)
Glucose: 85 mg/dL (ref 65–99)
Potassium: 5 mmol/L (ref 3.5–5.2)
Sodium: 139 mmol/L (ref 134–144)

## 2016-10-24 LAB — CK: Total CK: 43016 U/L (ref 24–204)

## 2016-10-26 ENCOUNTER — Ambulatory Visit (INDEPENDENT_AMBULATORY_CARE_PROVIDER_SITE_OTHER): Payer: BLUE CROSS/BLUE SHIELD | Admitting: Urgent Care

## 2016-10-26 ENCOUNTER — Encounter: Payer: Self-pay | Admitting: Urgent Care

## 2016-10-26 VITALS — BP 102/60 | HR 80 | Temp 98.7°F | Resp 16 | Ht 67.0 in | Wt 131.6 lb

## 2016-10-26 DIAGNOSIS — M79602 Pain in left arm: Secondary | ICD-10-CM

## 2016-10-26 DIAGNOSIS — M791 Myalgia, unspecified site: Secondary | ICD-10-CM

## 2016-10-26 DIAGNOSIS — M79601 Pain in right arm: Secondary | ICD-10-CM

## 2016-10-26 DIAGNOSIS — R748 Abnormal levels of other serum enzymes: Secondary | ICD-10-CM

## 2016-10-26 LAB — POCT URINALYSIS DIP (MANUAL ENTRY)
Bilirubin, UA: NEGATIVE
Blood, UA: NEGATIVE
Glucose, UA: NEGATIVE mg/dL
Ketones, POC UA: NEGATIVE mg/dL
Leukocytes, UA: NEGATIVE
Nitrite, UA: NEGATIVE
Protein Ur, POC: NEGATIVE mg/dL
Spec Grav, UA: 1.015 (ref 1.010–1.025)
Urobilinogen, UA: 0.2 E.U./dL
pH, UA: 8.5 — AB (ref 5.0–8.0)

## 2016-10-26 NOTE — Progress Notes (Signed)
    MRN: [id number] DOB: September 28, 1990  Subjective:   Dustin Weber is a 26 y.o. male presenting for follow up on bilateral arm pain. Last OV was 10/23/2016, labs showed 47K creatine phosphokinase. Reports that his arm pain is improved but still there. He feels the muscle relaxant is helping tremendously. He is moving his arms more and feels like he can stretch them better. Denies fever, tremors, swelling, weakness, n/v, hematuria.  Dustin Weber has a current medication list which includes the following prescription(s): albuterol, cyclobenzaprine, and meloxicam. Also has No Known Allergies.  El  has a past medical history of Asthma. Also  has no past surgical history on file.  Objective:   Vitals: BP 102/60 (BP Location: Right Arm, Patient Position: Sitting, Cuff Size: Small)   Pulse 80   Temp 98.7 F (37.1 C) (Oral)   Resp 16   Ht  (1.702 m)   Wt 131 lb 9.6 oz (59.7 kg)   SpO2 100%   BMI 20.61 kg/m   Physical Exam  Constitutional: He is oriented to person, place, and time. He appears well-developed and well-nourished.  Cardiovascular: Normal rate, regular rhythm and intact distal pulses.  Exam reveals no gallop and no friction rub.   No murmur heard. Pulmonary/Chest: No respiratory distress. He has no wheezes. He has no rales.  Abdominal: Soft. Bowel sounds are normal. He exhibits no distension and no mass. There is no tenderness. There is no guarding.  Musculoskeletal:       Right upper arm: He exhibits no tenderness, no bony tenderness, no swelling, no edema, no deformity and no laceration.       Left upper arm: He exhibits no tenderness, no bony tenderness, no swelling, no edema, no deformity and no laceration.       Arms: Neurological: He is alert and oriented to person, place, and time.  Skin: Skin is warm and dry.  Psychiatric: He has a normal mood and affect.   Results for orders placed or performed in visit on 10/26/16 (from the past 24 hour(s))  POCT urinalysis  dipstick     Status: Abnormal   Collection Time: 10/26/16 12:33 PM  Result Value Ref Range   Color, UA yellow yellow   Clarity, UA clear clear   Glucose, UA negative negative mg/dL   Bilirubin, UA negative negative   Ketones, POC UA negative negative mg/dL   Spec Grav, UA 4.098 1.191 - 1.025   Blood, UA negative negative   pH, UA 8.5 (A) 5.0 - 8.0   Protein Ur, POC negative negative mg/dL   Urobilinogen, UA 0.2 0.2 or 1.0 E.U./dL   Nitrite, UA Negative Negative   Leukocytes, UA Negative Negative    Assessment and Plan :   1. Elevated creatine phosphokinase level 2. Myalgia 3. Bilateral arm pain - Improved, continue to monitor. Hydrate well, use Flexeril and APAP. Continue work restrictions. Return-to-clinic precautions discussed, patient verbalized understanding. Labs pending. - Basic metabolic panel - CK - ANA - Rheumatoid factor  Wallis Bamberg, PA-C [facility] [phone] 10/26/2016 11:41 AM

## 2016-10-26 NOTE — Patient Instructions (Addendum)
Rhabdomyolysis Rhabdomyolysis is a condition that happens when muscle cells break down and release substances into the blood that can damage the kidneys. This condition happens because of damage to the muscles that move bones (skeletal muscle). When the skeletal muscles are damaged, substances inside the muscle cells go into the blood. One of these substances is a protein called myoglobin. Large amounts of myoglobin can cause kidney damage or kidney failure. Other substances that are released by muscle cells may upset the balance of the minerals (electrolytes) in your blood. This imbalance causes your blood to have too much acid (acidosis). What are the causes? This condition is caused by muscle damage. Muscle damage often happens because of:  Using your muscles too much.  An injury that crushes or squeezes a muscle too tightly.  Using illegal drugs, mainly cocaine.  Alcohol abuse. Other possible causes include:  Prescription medicines, such as those that:  Lower cholesterol (statins).  Treat ADHD (attention deficit hyperactivity disorder) or help with weight loss (amphetamines).  Treat pain (opiates).  Infections.  Muscle diseases that are passed down from parent to child (inherited).  High fever.  Heatstroke.  Not having enough fluids in your body (dehydration).  Seizures.  Surgery. What increases the risk? This condition is more likely to develop in people who:  Have a family history of muscle disease.  Take part in extreme sports, such as running in marathons.  Have diabetes.  Are older.  Abuse drugs or alcohol. What are the signs or symptoms? Symptoms of this condition vary. Some people have very few symptoms, and other people have many symptoms. The most common symptoms include:  Muscle pain and swelling.  Weak muscles.  Dark urine.  Feeling weak and tired. Other symptoms include:  Nausea and vomiting.  Fever.  Pain in the abdomen.  Pain in the  joints. Symptoms of complications from this condition include:  Heart rhythm that is not normal (arrhythmia).  Seizures.  Not urinating enough because of kidney failure.  Very low blood pressure (shock). Signs of shock include dizziness, blurry vision, and clammy skin.  Bleeding that is hard to stop or control. How is this diagnosed? This condition is diagnosed based on your medical history, your symptoms, and a physical exam. Tests may also be done, including:  Blood tests.  Urine tests to check for myoglobin. You may also have other tests to check for causes of muscle damage and to check for complications. How is this treated? Treatment for this condition helps to:  Make sure you have enough fluids in your body.  Lower the acid levels in your blood to reverse acidosis.  Protect your kidneys. Treatment may include:  Fluids and medicines given through an IV tube that is inserted into one of your veins.  Medicines to lower acidosis or to bring back the balance of the minerals in your body.  Hemodialysis. This treatment uses an artificial kidney machine to filter your blood while you recover. You may have this if other treatments are not helping. Follow these instructions at home:  Take over-the-counter and prescription medicines only as told by your health care provider.  Rest at home until your health care provider says that you can return to your normal activities.  Drink enough fluid to keep your urine clear or pale yellow.  Do not do activities that take a lot of effort (are strenuous). Ask your health care provider what level of exercise is safe for you.  Do not abuse drugs or alcohol.  If you are having problems with drug or alcohol use, ask your health care provider for help.  Keep all follow-up visits as told by your health care provider. This is important. Contact a health care provider if:  You start having symptoms of this condition after treatment. Get help  right away if:  You have a seizure.  You bleed easily or cannot control bleeding.  You cannot urinate.  You have chest pain.  You have trouble breathing. This information is not intended to replace advice given to you by your health care provider. Make sure you discuss any questions you have with your health care provider. Document Released: 06/08/2004 Document Revised: 04/07/2016 Document Reviewed: 04/07/2016 Elsevier Interactive Patient Education  2017 [employer].     IF you received an x-ray today, you will receive an invoice from [facility]. Please contact [facility] at [phone] with questions or concerns regarding your invoice.   IF you received labwork today, you will receive an invoice from [place]. Please contact [facility] at [phone] with questions or concerns regarding your invoice.   Our billing staff will not be able to assist you with questions regarding bills from these companies.  You will be contacted with the lab results as soon as they are available. The fastest way to get your results is to activate your My Chart account. Instructions are located on the last page of this paperwork. If you have not heard from [place] regarding the results in 2 weeks, please contact this office.

## 2016-10-27 LAB — BASIC METABOLIC PANEL
BUN/Creatinine Ratio: 19 (ref 9–20)
BUN: 18 mg/dL (ref 6–20)
CO2: 31 mmol/L — ABNORMAL HIGH (ref 18–29)
Calcium: 10.1 mg/dL (ref 8.7–10.2)
Chloride: 98 mmol/L (ref 96–106)
Creatinine, Ser: 0.96 mg/dL (ref 0.76–1.27)
GFR calc Af Amer: 128 mL/min/{1.73_m2} (ref >59)
GFR calc non Af Amer: 111 mL/min/{1.73_m2} (ref >59)
Glucose: 79 mg/dL (ref 65–99)
Potassium: 5 mmol/L (ref 3.5–5.2)
Sodium: 141 mmol/L (ref 134–144)

## 2016-10-27 LAB — CK: Total CK: 11352 U/L (ref 24–204)

## 2016-10-27 LAB — RHEUMATOID FACTOR: Rhuematoid fact SerPl-aCnc: <10 IU/mL (ref 0.0–13.9)

## 2016-10-27 LAB — ANA: Anti Nuclear Antibody(ANA): NEGATIVE

## 2016-10-30 ENCOUNTER — Ambulatory Visit: Payer: BLUE CROSS/BLUE SHIELD

## 2016-11-02 ENCOUNTER — Ambulatory Visit (INDEPENDENT_AMBULATORY_CARE_PROVIDER_SITE_OTHER): Payer: BLUE CROSS/BLUE SHIELD | Admitting: Urgent Care

## 2016-11-02 ENCOUNTER — Encounter: Payer: Self-pay | Admitting: Urgent Care

## 2016-11-02 VITALS — BP 129/80 | HR 100 | Temp 98.1°F | Resp 18 | Ht 67.0 in | Wt 127.8 lb

## 2016-11-02 DIAGNOSIS — M791 Myalgia, unspecified site: Secondary | ICD-10-CM

## 2016-11-02 DIAGNOSIS — R2242 Localized swelling, mass and lump, left lower limb: Secondary | ICD-10-CM

## 2016-11-02 DIAGNOSIS — M6282 Rhabdomyolysis: Secondary | ICD-10-CM | POA: Diagnosis not present

## 2016-11-02 DIAGNOSIS — R748 Abnormal levels of other serum enzymes: Secondary | ICD-10-CM | POA: Diagnosis not present

## 2016-11-02 NOTE — Patient Instructions (Addendum)
Rhabdomyolysis Rhabdomyolysis is a condition that happens when muscle cells break down and release substances into the blood that can damage the kidneys. This condition happens because of damage to the muscles that move bones (skeletal muscle). When the skeletal muscles are damaged, substances inside the muscle cells go into the blood. One of these substances is a protein called myoglobin. Large amounts of myoglobin can cause kidney damage or kidney failure. Other substances that are released by muscle cells may upset the balance of the minerals (electrolytes) in your blood. This imbalance causes your blood to have too much acid (acidosis). What are the causes? This condition is caused by muscle damage. Muscle damage often happens because of:  Using your muscles too much.  An injury that crushes or squeezes a muscle too tightly.  Using illegal drugs, mainly cocaine.  Alcohol abuse. Other possible causes include:  Prescription medicines, such as those that:  Lower cholesterol (statins).  Treat ADHD (attention deficit hyperactivity disorder) or help with weight loss (amphetamines).  Treat pain (opiates).  Infections.  Muscle diseases that are passed down from parent to child (inherited).  High fever.  Heatstroke.  Not having enough fluids in your body (dehydration).  Seizures.  Surgery. What increases the risk? This condition is more likely to develop in people who:  Have a family history of muscle disease.  Take part in extreme sports, such as running in marathons.  Have diabetes.  Are older.  Abuse drugs or alcohol. What are the signs or symptoms? Symptoms of this condition vary. Some people have very few symptoms, and other people have many symptoms. The most common symptoms include:  Muscle pain and swelling.  Weak muscles.  Dark urine.  Feeling weak and tired. Other symptoms include:  Nausea and vomiting.  Fever.  Pain in the abdomen.  Pain in the  joints. Symptoms of complications from this condition include:  Heart rhythm that is not normal (arrhythmia).  Seizures.  Not urinating enough because of kidney failure.  Very low blood pressure (shock). Signs of shock include dizziness, blurry vision, and clammy skin.  Bleeding that is hard to stop or control. How is this diagnosed? This condition is diagnosed based on your medical history, your symptoms, and a physical exam. Tests may also be done, including:  Blood tests.  Urine tests to check for myoglobin. You may also have other tests to check for causes of muscle damage and to check for complications. How is this treated? Treatment for this condition helps to:  Make sure you have enough fluids in your body.  Lower the acid levels in your blood to reverse acidosis.  Protect your kidneys. Treatment may include:  Fluids and medicines given through an IV tube that is inserted into one of your veins.  Medicines to lower acidosis or to bring back the balance of the minerals in your body.  Hemodialysis. This treatment uses an artificial kidney machine to filter your blood while you recover. You may have this if other treatments are not helping. Follow these instructions at home:  Take over-the-counter and prescription medicines only as told by your health care provider.  Rest at home until your health care provider says that you can return to your normal activities.  Drink enough fluid to keep your urine clear or pale yellow.  Do not do activities that take a lot of effort (are strenuous). Ask your health care provider what level of exercise is safe for you.  Do not abuse drugs or alcohol.  If you are having problems with drug or alcohol use, ask your health care provider for help.  Keep all follow-up visits as told by your health care provider. This is important. Contact a health care provider if:  You start having symptoms of this condition after treatment. Get help  right away if:  You have a seizure.  You bleed easily or cannot control bleeding.  You cannot urinate.  You have chest pain.  You have trouble breathing. This information is not intended to replace advice given to you by your health care provider. Make sure you discuss any questions you have with your health care provider. Document Released: 06/08/2004 Document Revised: 04/07/2016 Document Reviewed: 04/07/2016 Elsevier Interactive Patient Education  2017 Elsevier Inc.     IF you received an x-ray today, you will receive an invoice from [facility]. Please contact [facility] at [phone] with questions or concerns regarding your invoice.   IF you received labwork today, you will receive an invoice from [facility]. Please contact [facility] at [phone] with questions or concerns regarding your invoice.   Our billing staff will not be able to assist you with questions regarding bills from these companies.  You will be contacted with the lab results as soon as they are available. The fastest way to get your results is to activate your My Chart account. Instructions are located on the last page of this paperwork. If you have not heard from us regarding the results in 2 weeks, please contact this office.     

## 2016-11-02 NOTE — Progress Notes (Signed)
  MRN: [id number] DOB: 04/01/1991  Subjective:   Dustin Weber is a 26 y.o. male presenting for chief complaint of Follow-up (1 week f/u)  Rhabdomyolysis - Reports significant improvement in his ROM. Feels like he can move his arms normally again. He still feels pain when he has to use his strength but overall feels very good. He is hydrating well, not using APAP. He is also worried about a longstanding left lower leg mass. 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 If patient is on his feet and active a lot, he can get sore over the area. Denies fever, redness, pain, swelling.  Dustin Weber has a current medication list which includes the following prescription(s): albuterol and cyclobenzaprine. Also has No Known Allergies. Dustin Weber  has a past medical history of Asthma. Also  has no past surgical history on file.  Objective:   Vitals: BP 129/80   Pulse 100   Temp 98.1 F (36.7 C) (Oral)   Resp 18   Ht  (1.702 m)   Wt 127 lb 12.8 oz (58 kg)   SpO2 100%   BMI 20.02 kg/m   Physical Exam  Constitutional: He is oriented to person, place, and time. He appears well-developed and well-nourished.  Cardiovascular: Normal rate.   Pulmonary/Chest: Effort normal.  Musculoskeletal:       Thoracic back: He exhibits spasm. He exhibits normal range of motion, no tenderness, no bony tenderness, no swelling and no edema.       Right upper arm: He exhibits tenderness (mild with strength testing). He exhibits no bony tenderness, no swelling, no edema, no deformity and no laceration.       Left upper arm: He exhibits no tenderness, no bony tenderness, no swelling, no edema, no deformity and no laceration.       Legs: Neurological: He is alert and oriented to person, place, and time.   Assessment and Plan :   1. Non-traumatic rhabdomyolysis 2. Elevated creatine phosphokinase level 3. Myalgia - Improving, labs pending. Continue conservative management. F/u in 1 week to make sure CK keeps  trending down.  4. Mass of left lower leg - I suspect a lipoma but offered patient an U/S. He will let me know if he wants to pursue this.   Wallis Bamberg, PA-C Primary Care at [facility] [phone] 11/02/2016  12:00 PM

## 2016-11-03 LAB — CK: Total CK: 872 U/L (ref 24–204)

## 2016-11-03 LAB — BASIC METABOLIC PANEL
BUN/Creatinine Ratio: 20 (ref 9–20)
BUN: 22 mg/dL — ABNORMAL HIGH (ref 6–20)
CO2: 26 mmol/L (ref 18–29)
Calcium: 10.3 mg/dL — ABNORMAL HIGH (ref 8.7–10.2)
Chloride: 103 mmol/L (ref 96–106)
Creatinine, Ser: 1.09 mg/dL (ref 0.76–1.27)
GFR calc Af Amer: 110 mL/min/{1.73_m2} (ref >59)
GFR calc non Af Amer: 95 mL/min/{1.73_m2} (ref >59)
Glucose: 94 mg/dL (ref 65–99)
Potassium: 5 mmol/L (ref 3.5–5.2)
Sodium: 143 mmol/L (ref 134–144)

## 2016-11-09 ENCOUNTER — Encounter: Payer: Self-pay | Admitting: Urgent Care

## 2016-11-09 ENCOUNTER — Ambulatory Visit (INDEPENDENT_AMBULATORY_CARE_PROVIDER_SITE_OTHER): Payer: BLUE CROSS/BLUE SHIELD | Admitting: Urgent Care

## 2016-11-09 VITALS — BP 128/79 | HR 103 | Temp 98.0°F | Resp 16 | Ht 66.5 in | Wt 129.2 lb

## 2016-11-09 DIAGNOSIS — M6282 Rhabdomyolysis: Secondary | ICD-10-CM

## 2016-11-09 DIAGNOSIS — R748 Abnormal levels of other serum enzymes: Secondary | ICD-10-CM

## 2016-11-09 DIAGNOSIS — M791 Myalgia, unspecified site: Secondary | ICD-10-CM

## 2016-11-09 NOTE — Progress Notes (Signed)
    MRN: [id number] DOB: 1991/02/09  Subjective:   Dustin Weber is a 26 y.o. male presenting for follow up on rhabdomyolysis. CK levels have been trending downward, last OV showed CK of 872. Reports that he continues to see improvement. His work requires lifting but with work restrictions he has been doing okay with this. Denies fever, myalgia, tremors, swelling, hematuria, n/v, abdominal pain, flank pain.  Dustin Weber has a current medication list which includes the following prescription(s): albuterol and cyclobenzaprine. Also has No Known Allergies.  Dustin Weber  has a past medical history of Asthma. Also  has no past surgical history on file.  Objective:   Vitals: BP 128/79 (BP Location: Right Arm, Patient Position: Sitting, Cuff Size: Normal)   Pulse (!) 103   Temp 98 F (36.7 C) (Oral)   Resp 16   Ht 5' 6.5" (1.689 m)   Wt 129 lb 3.2 oz (58.6 kg)   SpO2 100%   BMI 20.54 kg/m   Physical Exam  Constitutional: He is oriented to person, place, and time. He appears well-developed and well-nourished.  Cardiovascular: Normal rate.   Pulmonary/Chest: Effort normal.  Musculoskeletal:       Right upper arm: He exhibits no tenderness, no bony tenderness, no swelling, no edema, no deformity and no laceration.       Left upper arm: He exhibits no tenderness, no bony tenderness, no swelling, no edema, no deformity and no laceration.  Neurological: He is alert and oriented to person, place, and time.  Skin: Skin is warm and dry.  Psychiatric: He has a normal mood and affect.   Assessment and Plan :   1. Non-traumatic rhabdomyolysis 2. Elevated creatine phosphokinase level 3. Myalgia - CK levels have been trending downward. Patient has progressively improved and is doing very well. Labs are pending. Will maintain work restrictions for another 2 weeks. F/u at that point for repeat CK levels.   Dustin BambergMario Lorren Rossetti, PA-C [facility] [phone] 11/09/2016  1:38 PM

## 2016-11-09 NOTE — Patient Instructions (Addendum)
Rhabdomyolysis Rhabdomyolysis is a condition that happens when muscle cells break down and release substances into the blood that can damage the kidneys. This condition happens because of damage to the muscles that move bones (skeletal muscle). When the skeletal muscles are damaged, substances inside the muscle cells go into the blood. One of these substances is a protein called myoglobin. Large amounts of myoglobin can cause kidney damage or kidney failure. Other substances that are released by muscle cells may upset the balance of the minerals (electrolytes) in your blood. This imbalance causes your blood to have too much acid (acidosis). What are the causes? This condition is caused by muscle damage. Muscle damage often happens because of:  Using your muscles too much.  An injury that crushes or squeezes a muscle too tightly.  Using illegal drugs, mainly cocaine.  Alcohol abuse. Other possible causes include:  Prescription medicines, such as those that:  Lower cholesterol (statins).  Treat ADHD (attention deficit hyperactivity disorder) or help with weight loss (amphetamines).  Treat pain (opiates).  Infections.  Muscle diseases that are passed down from parent to child (inherited).  High fever.  Heatstroke.  Not having enough fluids in your body (dehydration).  Seizures.  Surgery. What increases the risk? This condition is more likely to develop in people who:  Have a family history of muscle disease.  Take part in extreme sports, such as running in marathons.  Have diabetes.  Are older.  Abuse drugs or alcohol. What are the signs or symptoms? Symptoms of this condition vary. Some people have very few symptoms, and other people have many symptoms. The most common symptoms include:  Muscle pain and swelling.  Weak muscles.  Dark urine.  Feeling weak and tired. Other symptoms include:  Nausea and vomiting.  Fever.  Pain in the abdomen.  Pain in the  joints. Symptoms of complications from this condition include:  Heart rhythm that is not normal (arrhythmia).  Seizures.  Not urinating enough because of kidney failure.  Very low blood pressure (shock). Signs of shock include dizziness, blurry vision, and clammy skin.  Bleeding that is hard to stop or control. How is this diagnosed? This condition is diagnosed based on your medical history, your symptoms, and a physical exam. Tests may also be done, including:  Blood tests.  Urine tests to check for myoglobin. You may also have other tests to check for causes of muscle damage and to check for complications. How is this treated? Treatment for this condition helps to:  Make sure you have enough fluids in your body.  Lower the acid levels in your blood to reverse acidosis.  Protect your kidneys. Treatment may include:  Fluids and medicines given through an IV tube that is inserted into one of your veins.  Medicines to lower acidosis or to bring back the balance of the minerals in your body.  Hemodialysis. This treatment uses an artificial kidney machine to filter your blood while you recover. You may have this if other treatments are not helping. Follow these instructions at home:  Take over-the-counter and prescription medicines only as told by your health care provider.  Rest at home until your health care provider says that you can return to your normal activities.  Drink enough fluid to keep your urine clear or pale yellow.  Do not do activities that take a lot of effort (are strenuous). Ask your health care provider what level of exercise is safe for you.  Do not abuse drugs or alcohol.  If you are having problems with drug or alcohol use, ask your health care provider for help.  Keep all follow-up visits as told by your health care provider. This is important. Contact a health care provider if:  You start having symptoms of this condition after treatment. Get help  right away if:  You have a seizure.  You bleed easily or cannot control bleeding.  You cannot urinate.  You have chest pain.  You have trouble breathing. This information is not intended to replace advice given to you by your health care provider. Make sure you discuss any questions you have with your health care provider. Document Released: 06/08/2004 Document Revised: 04/07/2016 Document Reviewed: 04/07/2016 Elsevier Interactive Patient Education  2017 [employer]     IF you received an x-ray today, you will receive an invoice from [facility]. Please contact [facility] at [phone] with questions or concerns regarding your invoice.   IF you received labwork today, you will receive an invoice from [facility]. Please contact [facility] at [phone] with questions or concerns regarding your invoice.   Our billing staff will not be able to assist you with questions regarding bills from these companies.  You will be contacted with the lab results as soon as they are available. The fastest way to get your results is to activate your My Chart account. Instructions are located on the last page of this paperwork. If you have not heard from us regarding the results in 2 weeks, please contact this office.

## 2016-11-10 LAB — BASIC METABOLIC PANEL
BUN/Creatinine Ratio: 16 (ref 9–20)
BUN: 15 mg/dL (ref 6–20)
CO2: 25 mmol/L (ref 18–29)
Calcium: 10.2 mg/dL (ref 8.7–10.2)
Chloride: 101 mmol/L (ref 96–106)
Creatinine, Ser: 0.94 mg/dL (ref 0.76–1.27)
GFR calc Af Amer: 132 mL/min/{1.73_m2} (ref >59)
GFR calc non Af Amer: 114 mL/min/{1.73_m2} (ref >59)
Glucose: 87 mg/dL (ref 65–99)
Potassium: 4.6 mmol/L (ref 3.5–5.2)
Sodium: 143 mmol/L (ref 134–144)

## 2016-11-10 LAB — CK: Total CK: 325 U/L — ABNORMAL HIGH (ref 24–204)

## 2016-11-23 ENCOUNTER — Ambulatory Visit: Payer: BLUE CROSS/BLUE SHIELD | Admitting: Urgent Care

## 2016-11-27 ENCOUNTER — Ambulatory Visit: Payer: BLUE CROSS/BLUE SHIELD | Admitting: Urgent Care

## 2016-11-30 ENCOUNTER — Ambulatory Visit (INDEPENDENT_AMBULATORY_CARE_PROVIDER_SITE_OTHER): Payer: BLUE CROSS/BLUE SHIELD | Admitting: Urgent Care

## 2016-11-30 ENCOUNTER — Encounter: Payer: Self-pay | Admitting: Urgent Care

## 2016-11-30 VITALS — BP 133/78 | HR 91 | Temp 98.1°F | Resp 16 | Ht 66.5 in | Wt 129.6 lb

## 2016-11-30 DIAGNOSIS — R748 Abnormal levels of other serum enzymes: Secondary | ICD-10-CM

## 2016-11-30 DIAGNOSIS — M791 Myalgia, unspecified site: Secondary | ICD-10-CM

## 2016-11-30 DIAGNOSIS — M6282 Rhabdomyolysis: Secondary | ICD-10-CM

## 2016-11-30 NOTE — Progress Notes (Signed)
    MRN: [id number] DOB: 23-Apr-1991  Subjective:   Stephannie LiKaryon A Jeffcoat is a 26 y.o. male presenting for follow up on rhabdomyolysis. He has most of his ROM, but still feels like he is at ~50% of what he was able to do before. He feels like he gets muscular knots in his forearms and pain in his triceps, has difficult with overhead activity. 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 Denies swelling, redness, stiffness, weakness. Patient is hydrating well, drinking Gatorade as well.  Court has a current medication list which includes the following prescription(s): albuterol and cyclobenzaprine. Also has No Known Allergies. Kaisen  has a past medical history of Asthma. Also denies past surgical history.   Objective:   Vitals: BP 133/78 (BP Location: Right Arm, Patient Position: Sitting, Cuff Size: Normal)   Pulse 91   Temp 98.1 F (36.7 C) (Oral)   Resp 16   Ht 5' 6.5" (1.689 m)   Wt 129 lb 9.6 oz (58.8 kg)   SpO2 100%   BMI 20.60 kg/m   Physical Exam  Constitutional: He is oriented to person, place, and time. He appears well-developed and well-nourished.  Cardiovascular: Normal rate.   Pulmonary/Chest: Effort normal.  Musculoskeletal:       Right shoulder: He exhibits normal range of motion, no tenderness, no bony tenderness, no swelling, no effusion, no crepitus, no deformity, no laceration, no pain, no spasm and normal strength.       Left shoulder: He exhibits normal range of motion, no tenderness, no bony tenderness, no swelling, no effusion, no crepitus, no deformity, no laceration, no pain, no spasm and normal strength.       Right elbow: He exhibits normal range of motion, no swelling, no effusion, no deformity and no laceration. No tenderness found.       Left elbow: He exhibits normal range of motion, no swelling, no effusion, no deformity and no laceration. No tenderness found.       Right wrist: He exhibits normal range of motion, no tenderness, no bony tenderness, no  swelling, no effusion, no crepitus, no deformity and no laceration.       Left wrist: He exhibits normal range of motion, no tenderness, no bony tenderness, no swelling, no effusion, no crepitus, no deformity and no laceration.       Right upper arm: He exhibits no tenderness, no bony tenderness, no swelling, no edema, no deformity and no laceration.       Left upper arm: He exhibits no tenderness, no bony tenderness, no swelling, no edema, no deformity and no laceration.       Right forearm: He exhibits no tenderness, no bony tenderness, no swelling, no edema, no deformity and no laceration.       Left forearm: He exhibits no tenderness, no bony tenderness, no swelling, no edema, no deformity and no laceration.  Neurological: He is alert and oriented to person, place, and time.   Assessment and Plan :   1. Non-traumatic rhabdomyolysis 2. Elevated creatine phosphokinase level 3. Myalgia - Will maintain work restrictions. Labs pending, will f/u with results and treatment plan then.  Wallis BambergMario Delrose Rohwer, PA-C [facility] [phone] 11/30/2016 3:06 PM

## 2016-11-30 NOTE — Patient Instructions (Addendum)
Rhabdomyolysis Rhabdomyolysis is a condition that happens when muscle cells break down and release substances into the blood that can damage the kidneys. This condition happens because of damage to the muscles that move bones (skeletal muscle). When the skeletal muscles are damaged, substances inside the muscle cells go into the blood. One of these substances is a protein called myoglobin. Large amounts of myoglobin can cause kidney damage or kidney failure. Other substances that are released by muscle cells may upset the balance of the minerals (electrolytes) in your blood. This imbalance causes your blood to have too much acid (acidosis). What are the causes? This condition is caused by muscle damage. Muscle damage often happens because of:  Using your muscles too much.  An injury that crushes or squeezes a muscle too tightly.  Using illegal drugs, mainly cocaine.  Alcohol abuse. Other possible causes include:  Prescription medicines, such as those that:  Lower cholesterol (statins).  Treat ADHD (attention deficit hyperactivity disorder) or help with weight loss (amphetamines).  Treat pain (opiates).  Infections.  Muscle diseases that are passed down from parent to child (inherited).  High fever.  Heatstroke.  Not having enough fluids in your body (dehydration).  Seizures.  Surgery. What increases the risk? This condition is more likely to develop in people who:  Have a family history of muscle disease.  Take part in extreme sports, such as running in marathons.  Have diabetes.  Are older.  Abuse drugs or alcohol. What are the signs or symptoms? Symptoms of this condition vary. Some people have very few symptoms, and other people have many symptoms. The most common symptoms include:  Muscle pain and swelling.  Weak muscles.  Dark urine.  Feeling weak and tired. Other symptoms include:  Nausea and vomiting.  Fever.  Pain in the abdomen.  Pain in the  joints. Symptoms of complications from this condition include:  Heart rhythm that is not normal (arrhythmia).  Seizures.  Not urinating enough because of kidney failure.  Very low blood pressure (shock). Signs of shock include dizziness, blurry vision, and clammy skin.  Bleeding that is hard to stop or control. How is this diagnosed? This condition is diagnosed based on your medical history, your symptoms, and a physical exam. Tests may also be done, including:  Blood tests.  Urine tests to check for myoglobin. You may also have other tests to check for causes of muscle damage and to check for complications. How is this treated? Treatment for this condition helps to:  Make sure you have enough fluids in your body.  Lower the acid levels in your blood to reverse acidosis.  Protect your kidneys. Treatment may include:  Fluids and medicines given through an IV tube that is inserted into one of your veins.  Medicines to lower acidosis or to bring back the balance of the minerals in your body.  Hemodialysis. This treatment uses an artificial kidney machine to filter your blood while you recover. You may have this if other treatments are not helping. Follow these instructions at home:  Take over-the-counter and prescription medicines only as told by your health care provider.  Rest at home until your health care provider says that you can return to your normal activities.  Drink enough fluid to keep your urine clear or pale yellow.  Do not do activities that take a lot of effort (are strenuous). Ask your health care provider what level of exercise is safe for you.  Do not abuse drugs or alcohol.  If you are having problems with drug or alcohol use, ask your health care provider for help.  Keep all follow-up visits as told by your health care provider. This is important. Contact a health care provider if:  You start having symptoms of this condition after treatment. Get help  right away if:  You have a seizure.  You bleed easily or cannot control bleeding.  You cannot urinate.  You have chest pain.  You have trouble breathing. This information is not intended to replace advice given to you by your health care provider. Make sure you discuss any questions you have with your health care provider. Document Released: 06/08/2004 Document Revised: 04/07/2016 Document Reviewed: 04/07/2016 Elsevier Interactive Patient Education  2017 [employer]     IF you received an x-ray today, you will receive an invoice from [facility]. Please contact [facility] at [phone] with questions or concerns regarding your invoice.   IF you received labwork today, you will receive an invoice from [facility]. Please contact [facility] at [phone] with questions or concerns regarding your invoice.   Our billing staff will not be able to assist you with questions regarding bills from these companies.  You will be contacted with the lab results as soon as they are available. The fastest way to get your results is to activate your My Chart account. Instructions are located on the last page of this paperwork. If you have not heard from us regarding the results in 2 weeks, please contact this office.

## 2016-12-01 LAB — BASIC METABOLIC PANEL
BUN/Creatinine Ratio: 14 (ref 9–20)
BUN: 15 mg/dL (ref 6–20)
CO2: 24 mmol/L (ref 18–29)
Calcium: 9.9 mg/dL (ref 8.7–10.2)
Chloride: 101 mmol/L (ref 96–106)
Creatinine, Ser: 1.07 mg/dL (ref 0.76–1.27)
GFR calc Af Amer: 113 mL/min/{1.73_m2} (ref >59)
GFR calc non Af Amer: 97 mL/min/{1.73_m2} (ref >59)
Glucose: 83 mg/dL (ref 65–99)
Potassium: 4.3 mmol/L (ref 3.5–5.2)
Sodium: 139 mmol/L (ref 134–144)

## 2016-12-01 LAB — CK: Total CK: 704 U/L (ref 24–204)

## 2016-12-01 LAB — SEDIMENTATION RATE: Sed Rate: 2 mm/hr (ref 0–15)

## 2016-12-07 ENCOUNTER — Other Ambulatory Visit: Payer: Self-pay | Admitting: Urgent Care

## 2016-12-07 DIAGNOSIS — R748 Abnormal levels of other serum enzymes: Secondary | ICD-10-CM

## 2016-12-07 DIAGNOSIS — M79602 Pain in left arm: Secondary | ICD-10-CM

## 2016-12-07 DIAGNOSIS — M79601 Pain in right arm: Secondary | ICD-10-CM

## 2016-12-08 ENCOUNTER — Telehealth: Payer: Self-pay | Admitting: General Practice

## 2016-12-08 NOTE — Telephone Encounter (Signed)
Pt is needing to talk with [employer] GibsonMani about his disability papers   Best number [phone]

## 2016-12-11 NOTE — Telephone Encounter (Signed)
I do not have any disability forms for this patient, if he is needing some then his employer needs to send them to me either through the mail or fax or patient can bring in. But as of 12/11/16 there is no request for any disability paperwork for this patient.

## 2016-12-12 NOTE — Telephone Encounter (Signed)
Yes - I will be happy to help him with this. Just need the forms.

## 2016-12-14 ENCOUNTER — Ambulatory Visit (INDEPENDENT_AMBULATORY_CARE_PROVIDER_SITE_OTHER): Payer: BLUE CROSS/BLUE SHIELD | Admitting: Urgent Care

## 2016-12-14 ENCOUNTER — Encounter: Payer: Self-pay | Admitting: Urgent Care

## 2016-12-14 VITALS — BP 131/73 | HR 83 | Temp 97.6°F | Resp 16 | Ht 66.5 in | Wt 134.2 lb

## 2016-12-14 DIAGNOSIS — M791 Myalgia, unspecified site: Secondary | ICD-10-CM

## 2016-12-14 DIAGNOSIS — R748 Abnormal levels of other serum enzymes: Secondary | ICD-10-CM | POA: Diagnosis not present

## 2016-12-14 DIAGNOSIS — M6282 Rhabdomyolysis: Secondary | ICD-10-CM | POA: Diagnosis not present

## 2016-12-14 NOTE — Patient Instructions (Addendum)
Rhabdomyolysis Rhabdomyolysis is a condition that happens when muscle cells break down and release substances into the blood that can damage the kidneys. This condition happens because of damage to the muscles that move bones (skeletal muscle). When the skeletal muscles are damaged, substances inside the muscle cells go into the blood. One of these substances is a protein called myoglobin. Large amounts of myoglobin can cause kidney damage or kidney failure. Other substances that are released by muscle cells may upset the balance of the minerals (electrolytes) in your blood. This imbalance causes your blood to have too much acid (acidosis). What are the causes? This condition is caused by muscle damage. Muscle damage often happens because of:  Using your muscles too much.  An injury that crushes or squeezes a muscle too tightly.  Using illegal drugs, mainly cocaine.  Alcohol abuse.  Other possible causes include:  Prescription medicines, such as those that: ? Lower cholesterol (statins). ? Treat ADHD (attention deficit hyperactivity disorder) or help with weight loss (amphetamines). ? Treat pain (opiates).  Infections.  Muscle diseases that are passed down from parent to child (inherited).  High fever.  Heatstroke.  Not having enough fluids in your body (dehydration).  Seizures.  Surgery.  What increases the risk? This condition is more likely to develop in people who:  Have a family history of muscle disease.  Take part in extreme sports, such as running in marathons.  Have diabetes.  Are older.  Abuse drugs or alcohol.  What are the signs or symptoms? Symptoms of this condition vary. Some people have very few symptoms, and other people have many symptoms. The most common symptoms include:  Muscle pain and swelling.  Weak muscles.  Dark urine.  Feeling weak and tired.  Other symptoms include:  Nausea and vomiting.  Fever.  Pain in the abdomen.  Pain  in the joints.  Symptoms of complications from this condition include:  Heart rhythm that is not normal (arrhythmia).  Seizures.  Not urinating enough because of kidney failure.  Very low blood pressure (shock). Signs of shock include dizziness, blurry vision, and clammy skin.  Bleeding that is hard to stop or control.  How is this diagnosed? This condition is diagnosed based on your medical history, your symptoms, and a physical exam. Tests may also be done, including:  Blood tests.  Urine tests to check for myoglobin.  You may also have other tests to check for causes of muscle damage and to check for complications. How is this treated? Treatment for this condition helps to:  Make sure you have enough fluids in your body.  Lower the acid levels in your blood to reverse acidosis.  Protect your kidneys.  Treatment may include:  Fluids and medicines given through an IV tube that is inserted into one of your veins.  Medicines to lower acidosis or to bring back the balance of the minerals in your body.  Hemodialysis. This treatment uses an artificial kidney machine to filter your blood while you recover. You may have this if other treatments are not helping.  Follow these instructions at home:  Take over-the-counter and prescription medicines only as told by your health care provider.  Rest at home until your health care provider says that you can return to your normal activities.  Drink enough fluid to keep your urine clear or pale yellow.  Do not do activities that take a lot of effort (are strenuous). Ask your health care provider what level of exercise is safe   for you.  Do not abuse drugs or alcohol. If you are having problems with drug or alcohol use, ask your health care provider for help.  Keep all follow-up visits as told by your health care provider. This is important. Contact a health care provider if:  You start having symptoms of this condition after  treatment. Get help right away if:  You have a seizure.  You bleed easily or cannot control bleeding.  You cannot urinate.  You have chest pain.  You have trouble breathing. This information is not intended to replace advice given to you by your health care provider. Make sure you discuss any questions you have with your health care provider. Document Released: 06/08/2004 Document Revised: 04/07/2016 Document Reviewed: 04/07/2016 Elsevier Interactive Patient Education  2018 [employer]     IF you received an x-ray today, you will receive an invoice from [facility]. Please contact [facility] at [phone] with questions or concerns regarding your invoice.   IF you received labwork today, you will receive an invoice from [facility]. Please contact [facility] at [phone] with questions or concerns regarding your invoice.   Our billing staff will not be able to assist you with questions regarding bills from these companies.  You will be contacted with the lab results as soon as they are available. The fastest way to get your results is to activate your My Chart account. Instructions are located on the last page of this paperwork. If you have not heard from us regarding the results in 2 weeks, please contact this office.

## 2016-12-14 NOTE — Progress Notes (Signed)
    MRN: [id number] DOB: 1990/07/27  Subjective:   Dustin Weber is a 26 y.o. male presenting for follow up on rhabdomyolysis. Last OV was 11/30/2016, had elevated CK at 704 which was much higher than the previous level of 325 on 11/09/2016. Patient presents today for follow up. Reports pain over triceps still. Denies fever, trauma, lifting heavily, strenuous activity. He is hydrating very well.  Dustin Weber has a current medication list which includes the following prescription(s): albuterol and cyclobenzaprine. Also has No Known Allergies. Dustin Weber  has a past medical history of Asthma. Also denies past surgical history.  Objective:   Vitals: BP 131/73   Pulse 83   Temp 97.6 F (36.4 C) (Oral)   Resp 16   Ht 5' 6.5" (1.689 m)   Wt 134 lb 3.2 oz (60.9 kg)   SpO2 99%   BMI 21.34 kg/m   Physical Exam  Constitutional: He is oriented to person, place, and time. He appears well-developed and well-nourished.  Cardiovascular: Normal rate.   Pulmonary/Chest: Effort normal.  Musculoskeletal:       Right upper arm: He exhibits tenderness (over triceps). He exhibits no bony tenderness, no swelling, no edema, no deformity and no laceration.       Left upper arm: He exhibits no tenderness, no bony tenderness, no swelling, no edema, no deformity and no laceration.  Neurological: He is alert and oriented to person, place, and time. He displays normal reflexes.  Skin: Skin is warm and dry.  Psychiatric: He has a normal mood and affect.   Assessment and Plan :   1. Non-traumatic rhabdomyolysis 2. Elevated creatine phosphokinase level 3. Myalgia - Disability forms completed in office, referral placed to rheumatology.   Wallis BambergMario Dudley Mages, PA-C [facility] [phone] 12/14/2016 3:08 PM

## 2017-01-26 ENCOUNTER — Ambulatory Visit (INDEPENDENT_AMBULATORY_CARE_PROVIDER_SITE_OTHER): Payer: BLUE CROSS/BLUE SHIELD | Admitting: Urgent Care

## 2017-01-26 ENCOUNTER — Encounter: Payer: Self-pay | Admitting: Urgent Care

## 2017-01-26 VITALS — BP 129/74 | HR 100 | Temp 98.6°F | Resp 16 | Ht 66.5 in | Wt 124.6 lb

## 2017-01-26 DIAGNOSIS — M791 Myalgia, unspecified site: Secondary | ICD-10-CM

## 2017-01-26 DIAGNOSIS — M79602 Pain in left arm: Secondary | ICD-10-CM

## 2017-01-26 DIAGNOSIS — M62838 Other muscle spasm: Secondary | ICD-10-CM | POA: Diagnosis not present

## 2017-01-26 DIAGNOSIS — M79601 Pain in right arm: Secondary | ICD-10-CM | POA: Diagnosis not present

## 2017-01-26 DIAGNOSIS — M6282 Rhabdomyolysis: Secondary | ICD-10-CM | POA: Diagnosis not present

## 2017-01-26 DIAGNOSIS — R748 Abnormal levels of other serum enzymes: Secondary | ICD-10-CM

## 2017-01-26 MED ORDER — CYCLOBENZAPRINE HCL 5 MG PO TABS: 5.0000 mg | ORAL_TABLET | Freq: Three times a day (TID) | ORAL | 5 refills | Status: DC | PRN

## 2017-01-26 NOTE — Patient Instructions (Addendum)
Rhabdomyolysis Rhabdomyolysis is a condition that happens when muscle cells break down and release substances into the blood that can damage the kidneys. This condition happens because of damage to the muscles that move bones (skeletal muscle). When the skeletal muscles are damaged, substances inside the muscle cells go into the blood. One of these substances is a protein called myoglobin. Large amounts of myoglobin can cause kidney damage or kidney failure. Other substances that are released by muscle cells may upset the balance of the minerals (electrolytes) in your blood. This imbalance causes your blood to have too much acid (acidosis). What are the causes? This condition is caused by muscle damage. Muscle damage often happens because of:  Using your muscles too much.  An injury that crushes or squeezes a muscle too tightly.  Using illegal drugs, mainly cocaine.  Alcohol abuse.  Other possible causes include:  Prescription medicines, such as those that: ? Lower cholesterol (statins). ? Treat ADHD (attention deficit hyperactivity disorder) or help with weight loss (amphetamines). ? Treat pain (opiates).  Infections.  Muscle diseases that are passed down from parent to child (inherited).  High fever.  Heatstroke.  Not having enough fluids in your body (dehydration).  Seizures.  Surgery.  What increases the risk? This condition is more likely to develop in people who:  Have a family history of muscle disease.  Take part in extreme sports, such as running in marathons.  Have diabetes.  Are older.  Abuse drugs or alcohol.  What are the signs or symptoms? Symptoms of this condition vary. Some people have very few symptoms, and other people have many symptoms. The most common symptoms include:  Muscle pain and swelling.  Weak muscles.  Dark urine.  Feeling weak and tired.  Other symptoms include:  Nausea and vomiting.  Fever.  Pain in the abdomen.  Pain  in the joints.  Symptoms of complications from this condition include:  Heart rhythm that is not normal (arrhythmia).  Seizures.  Not urinating enough because of kidney failure.  Very low blood pressure (shock). Signs of shock include dizziness, blurry vision, and clammy skin.  Bleeding that is hard to stop or control.  How is this diagnosed? This condition is diagnosed based on your medical history, your symptoms, and a physical exam. Tests may also be done, including:  Blood tests.  Urine tests to check for myoglobin.  You may also have other tests to check for causes of muscle damage and to check for complications. How is this treated? Treatment for this condition helps to:  Make sure you have enough fluids in your body.  Lower the acid levels in your blood to reverse acidosis.  Protect your kidneys.  Treatment may include:  Fluids and medicines given through an IV tube that is inserted into one of your veins.  Medicines to lower acidosis or to bring back the balance of the minerals in your body.  Hemodialysis. This treatment uses an artificial kidney machine to filter your blood while you recover. You may have this if other treatments are not helping.  Follow these instructions at home:  Take over-the-counter and prescription medicines only as told by your health care provider.  Rest at home until your health care provider says that you can return to your normal activities.  Drink enough fluid to keep your urine clear or pale yellow.  Do not do activities that take a lot of effort (are strenuous). Ask your health care provider what level of exercise is safe   for you.  Do not abuse drugs or alcohol. If you are having problems with drug or alcohol use, ask your health care provider for help.  Keep all follow-up visits as told by your health care provider. This is important. Contact a health care provider if:  You start having symptoms of this condition after  treatment. Get help right away if:  You have a seizure.  You bleed easily or cannot control bleeding.  You cannot urinate.  You have chest pain.  You have trouble breathing. This information is not intended to replace advice given to you by your health care provider. Make sure you discuss any questions you have with your health care provider. Document Released: 06/08/2004 Document Revised: 04/07/2016 Document Reviewed: 04/07/2016 Elsevier Interactive Patient Education  2018 [employer]     IF you received an x-ray today, you will receive an invoice from [facility]. Please contact [facility] at [phone] with questions or concerns regarding your invoice.   IF you received labwork today, you will receive an invoice from [facility]. Please contact [facility] at [phone] with questions or concerns regarding your invoice.   Our billing staff will not be able to assist you with questions regarding bills from these companies.  You will be contacted with the lab results as soon as they are available. The fastest way to get your results is to activate your My Chart account. Instructions are located on the last page of this paperwork. If you have not heard from us regarding the results in 2 weeks, please contact this office.

## 2017-01-26 NOTE — Progress Notes (Signed)
    MRN: [id number] DOB: [birth year]  Subjective:   Dustin Weber is a 26 y.o. male presenting for follow up on rhabdomyolysis, elevated CK. Patient was referred to rheumatology, Dr. Casimer LaniusGovinda Aryal.  He was seen for consult and still shown to have an elevated CK and is planning on f/u in 1 week. His bilateral arm pain has improved. He is still not able to lift items at his work and needs a disability form completed. Hydrates very well.    Dustin Weber has a current medication list which includes the following prescription(s): albuterol and cyclobenzaprine. Also has No Known Allergies. Dustin Weber  has a past medical history of Asthma. Also denies past surgical history.   Objective:   Vitals: BP 129/74 (BP Location: Right Arm, Patient Position: Sitting, Cuff Size: Normal)   Pulse 100   Temp 98.6 F (37 C) (Oral)   Resp 16   Ht 5' 6.5" (1.689 m)   Wt 124 lb 9.6 oz (56.5 kg)   SpO2 99%   BMI 19.81 kg/m   Physical Exam  Constitutional: He is oriented to person, place, and time. He appears well-developed and well-nourished.  Cardiovascular: Normal rate.   Pulmonary/Chest: Effort normal.  Neurological: He is alert and oriented to person, place, and time.  Psychiatric: He has a normal mood and affect.   Assessment and Plan :   1. Non-traumatic rhabdomyolysis 2. Elevated creatine phosphokinase level 3. Bilateral arm pain 4. Myalgia 5. Muscle spasm - Disability form completed. Patient is to continue f/u with Dr. Deanne CofferAryal. Flexeril was refilled. Follow up as needed.  Wallis BambergMario Rickelle Sylvestre, PA-C [facility] [phone] 01/26/2017 1:49 PM

## 2017-02-01 ENCOUNTER — Ambulatory Visit (INDEPENDENT_AMBULATORY_CARE_PROVIDER_SITE_OTHER): Payer: BLUE CROSS/BLUE SHIELD | Admitting: Urgent Care

## 2017-02-01 ENCOUNTER — Encounter: Payer: Self-pay | Admitting: Urgent Care

## 2017-02-01 VITALS — BP 123/75 | HR 97 | Temp 98.3°F | Resp 18 | Ht 66.5 in | Wt 124.4 lb

## 2017-02-01 DIAGNOSIS — M791 Myalgia, unspecified site: Secondary | ICD-10-CM

## 2017-02-01 DIAGNOSIS — M549 Dorsalgia, unspecified: Secondary | ICD-10-CM

## 2017-02-01 DIAGNOSIS — R748 Abnormal levels of other serum enzymes: Secondary | ICD-10-CM | POA: Diagnosis not present

## 2017-02-01 DIAGNOSIS — M542 Cervicalgia: Secondary | ICD-10-CM | POA: Diagnosis not present

## 2017-02-01 DIAGNOSIS — M62830 Muscle spasm of back: Secondary | ICD-10-CM

## 2017-02-01 DIAGNOSIS — R221 Localized swelling, mass and lump, neck: Secondary | ICD-10-CM | POA: Diagnosis not present

## 2017-02-01 LAB — POCT URINALYSIS DIP (MANUAL ENTRY)
Bilirubin, UA: NEGATIVE
Blood, UA: NEGATIVE
Glucose, UA: NEGATIVE mg/dL
Leukocytes, UA: NEGATIVE
Nitrite, UA: NEGATIVE
Spec Grav, UA: 1.025 (ref 1.010–1.025)
Urobilinogen, UA: 0.2 E.U./dL
pH, UA: 5.5 (ref 5.0–8.0)
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
## 2017-02-01 NOTE — Patient Instructions (Addendum)
Back Pain, Adult Back pain is very common in adults.The cause of back pain is rarely dangerous and the pain often gets better over time.The cause of your back pain may not be known. Some common causes of back pain include:  Strain of the muscles or ligaments supporting the spine.  Wear and tear (degeneration) of the spinal disks.  Arthritis.  Direct injury to the back.  For many people, back pain may return. Since back pain is rarely dangerous, most people can learn to manage this condition on their own. Follow these instructions at home: Watch your back pain for any changes. The following actions may help to lessen any discomfort you are feeling:  Remain active. It is stressful on your back to sit or stand in one place for long periods of time. Do not sit, drive, or stand in one place for more than 30 minutes at a time. Take short walks on even surfaces as soon as you are able.Try to increase the length of time you walk each day.  Exercise regularly as directed by your health care provider. Exercise helps your back heal faster. It also helps avoid future injury by keeping your muscles strong and flexible.  Do not stay in bed.Resting more than 1-2 days can delay your recovery.  Pay attention to your body when you bend and lift. The most comfortable positions are those that put less stress on your recovering back. Always use proper lifting techniques, including: ? Bending your knees. ? Keeping the load close to your body. ? Avoiding twisting.  Find a comfortable position to sleep. Use a firm mattress and lie on your side with your knees slightly bent. If you lie on your back, put a pillow under your knees.  Avoid feeling anxious or stressed.Stress increases muscle tension and can worsen back pain.It is important to recognize when you are anxious or stressed and learn ways to manage it, such as with exercise.  Take medicines only as directed by your health care provider.  Over-the-counter medicines to reduce pain and inflammation are often the most helpful.Your health care provider may prescribe muscle relaxant drugs.These medicines help dull your pain so you can more quickly return to your normal activities and healthy exercise.  Apply ice to the injured area: ? Put ice in a plastic bag. ? Place a towel between your skin and the bag. ? Leave the ice on for 20 minutes, 2-3 times a day for the first 2-3 days. After that, ice and heat may be alternated to reduce pain and spasms.  Maintain a healthy weight. Excess weight puts extra stress on your back and makes it difficult to maintain good posture.  Contact a health care provider if:  You have pain that is not relieved with rest or medicine.  You have increasing pain going down into the legs or buttocks.  You have pain that does not improve in one week.  You have night pain.  You lose weight.  You have a fever or chills. Get help right away if:  You develop new bowel or bladder control problems.  You have unusual weakness or numbness in your arms or legs.  You develop nausea or vomiting.  You develop abdominal pain.  You feel faint. This information is not intended to replace advice given to you by your health care provider. Make sure you discuss any questions you have with your health care provider. Document Released: 06/26/2005 Document Revised: 11/04/2015 Document Reviewed: 10/28/2013 Elsevier Interactive Patient Education    2017 Elsevier Inc.    Muscle Cramps and Spasms Muscle cramps and spasms occur when a muscle or muscles tighten and you have no control over this tightening (involuntary muscle contraction). They are a common problem and can develop in any muscle. The most common place is in the calf muscles of the leg. Muscle cramps and muscle spasms are both involuntary muscle contractions, but there are some differences between the two:  Muscle cramps are painful. They come and go  and may last a few seconds to 15 minutes. Muscle cramps are often more forceful and last longer than muscle spasms.  Muscle spasms may or may not be painful. They may also last just a few seconds or much longer.  Certain medical conditions, such as diabetes or Parkinson disease, can make it more likely to develop cramps or spasms. However, cramps or spasms are usually not caused by a serious underlying problem. Common causes include:  Overexertion.  Overuse from repetitive motions, or doing the same thing over and over.  Remaining in a certain position for a long period of time.  Improper preparation, form, or technique while playing a sport or doing an activity.  Dehydration.  Injury.  Side effects of some medicines.  Abnormally low levels of the salts and ions in your blood (electrolytes), especially potassium and calcium. This could happen if you are taking water pills (diuretics) or if you are pregnant.  In many cases, the cause of muscle cramps or spasms is unknown. Follow these instructions at home:  Stay well hydrated. Drink enough fluid to keep your urine clear or pale yellow.  Try massaging, stretching, and relaxing the affected muscle.  If directed, apply heat to tight or tense muscles as often as told by your health care provider. Use the heat source that your health care provider recommends, such as a moist heat pack or a heating pad. ? Place a towel between your skin and the heat source. ? Leave the heat on for 20-30 minutes. ? Remove the heat if your skin turns bright red. This is especially important if you are unable to feel pain, heat, or cold. You may have a greater risk of getting burned.  If directed, put ice on the affected area. This may help if you are sore or have pain after a cramp or spasm. ? Put ice in a plastic bag. ? Place a towel between your skin and the bag. ? Leavethe ice on for 20 minutes, 2-3 times a day.  Take over-the-counter and  prescription medicines only as told by your health care provider.  Pay attention to any changes in your symptoms. Contact a health care provider if:  Your cramps or spasms get more severe or happen more often.  Your cramps or spasms do not improve over time. This information is not intended to replace advice given to you by your health care provider. Make sure you discuss any questions you have with your health care provider. Document Released: 12/16/2001 Document Revised: 07/28/2015 Document Reviewed: 03/30/2015 Elsevier Interactive Patient Education  2018 [employer]     IF you received an x-ray today, you will receive an invoice from [facility]. Please contact [facility] at [phone] with questions or concerns regarding your invoice.   IF you received labwork today, you will receive an invoice from [facility]. Please contact [facility] at [phone] with questions or concerns regarding your invoice.   Our billing staff will not be able to assist you with questions regarding bills from  these companies.  You will be contacted with the lab results as soon as they are available. The fastest way to get your results is to activate your My Chart account. Instructions are located on the last page of this paperwork. If you have not heard from [place] regarding the results in 2 weeks, please contact this office.

## 2017-02-02 LAB — BASIC METABOLIC PANEL
BUN/Creatinine Ratio: 17 (ref 9–20)
BUN: 19 mg/dL (ref 6–20)
CO2: 26 mmol/L (ref 20–29)
Calcium: 10.2 mg/dL (ref 8.7–10.2)
Chloride: 102 mmol/L (ref 96–106)
Creatinine, Ser: 1.09 mg/dL (ref 0.76–1.27)
GFR calc Af Amer: 110 mL/min/{1.73_m2} (ref >59)
GFR calc non Af Amer: 95 mL/min/{1.73_m2} (ref >59)
Glucose: 59 mg/dL — ABNORMAL LOW (ref 65–99)
Potassium: 4.5 mmol/L (ref 3.5–5.2)
Sodium: 144 mmol/L (ref 134–144)

## 2017-02-07 LAB — CK: Total CK: 264 U/L — ABNORMAL HIGH (ref 24–204)

## 2017-02-07 LAB — SPECIMEN STATUS REPORT

## 2017-02-07 NOTE — Progress Notes (Signed)
Lm for patient to call for results  

## 2017-02-17 ENCOUNTER — Ambulatory Visit (INDEPENDENT_AMBULATORY_CARE_PROVIDER_SITE_OTHER): Payer: BLUE CROSS/BLUE SHIELD | Admitting: Urgent Care

## 2017-02-17 ENCOUNTER — Encounter: Payer: Self-pay | Admitting: Urgent Care

## 2017-02-17 VITALS — BP 126/79 | HR 98 | Temp 98.3°F | Resp 16 | Ht 66.12 in | Wt 123.4 lb

## 2017-02-17 DIAGNOSIS — M791 Myalgia, unspecified site: Secondary | ICD-10-CM

## 2017-02-17 DIAGNOSIS — M79602 Pain in left arm: Secondary | ICD-10-CM | POA: Diagnosis not present

## 2017-02-17 DIAGNOSIS — M62838 Other muscle spasm: Secondary | ICD-10-CM | POA: Diagnosis not present

## 2017-02-17 DIAGNOSIS — M6282 Rhabdomyolysis: Secondary | ICD-10-CM | POA: Diagnosis not present

## 2017-02-17 DIAGNOSIS — M79601 Pain in right arm: Secondary | ICD-10-CM

## 2017-02-17 DIAGNOSIS — M542 Cervicalgia: Secondary | ICD-10-CM

## 2017-02-17 DIAGNOSIS — R748 Abnormal levels of other serum enzymes: Secondary | ICD-10-CM

## 2017-02-17 MED ORDER — CYCLOBENZAPRINE HCL 5 MG PO TABS: 5.0000 mg | ORAL_TABLET | Freq: Three times a day (TID) | ORAL | 5 refills | Status: DC | PRN

## 2017-02-17 NOTE — Patient Instructions (Addendum)
Muscle Pain, Adult Muscle pain (myalgia) may be mild or severe. In most cases, the pain lasts only a short time and it goes away without treatment. It is normal to feel some muscle pain after starting a workout program. Muscles that have not been used often will be sore at first. Muscle pain may also be caused by many other things, including:  Overuse or muscle strain, especially if you are not in shape. This is the most common cause of muscle pain.  Injury.  Bruises.  Viruses, such as the flu.  Infectious diseases.  A chronic condition that causes muscle tenderness, fatigue, and headache (fibromyalgia).  A condition, such as lupus, in which the body's disease-fighting system attacks other organs in the body (autoimmune or rheumatologic diseases).  Certain drugs, including ACE inhibitors and statins.  To diagnose the cause of your muscle pain, your health care provider will do a physical exam and ask questions about the pain and when it began. If you have not had muscle pain for very long, your health care provider may want to wait before doing much testing. If your muscle pain has lasted a long time, your health care provider may want to run tests right away. In some cases, this may include tests to rule out certain conditions or illnesses. Treatment for muscle pain depends on the cause. Home care is often enough to relieve muscle pain. Your health care provider may also prescribe anti-inflammatory medicine. Follow these instructions at home: Activity  If overuse is causing your muscle pain: ? Slow down your activities until the pain goes away. ? Do regular, gentle exercises if you are not usually active. ? Warm up before exercising. Stretch before and after exercising. This can help lower the risk of muscle pain.  Do not continue working out if the pain is very bad. Bad pain could mean that you have injured a muscle. Managing pain and discomfort   If directed, apply ice to the  sore muscle: ? Put ice in a plastic bag. ? Place a towel between your skin and the bag. ? Leave the ice on for 20 minutes, 2-3 times a day.  You may also alternate between applying ice and applying heat as told by your health care provider. To apply heat, use the heat source that your health care provider recommends, such as a moist heat pack or a heating pad. ? Place a towel between your skin and the heat source. ? Leave the heat on for 20-30 minutes. ? Remove the heat if your skin turns bright red. This is especially important if you are unable to feel pain, heat, or cold. You may have a greater risk of getting burned. Medicines  Take over-the-counter and prescription medicines only as told by your health care provider.  Do not drive or use heavy machinery while taking prescription pain medicine. Contact a health care provider if:  Your muscle pain gets worse and medicines do not help.  You have muscle pain that lasts longer than 3 days.  You have a rash or fever along with muscle pain.  You have muscle pain after a tick bite.  You have muscle pain while working out, even though you are in good physical condition.  You have redness, soreness, or swelling along with muscle pain.  You have muscle pain after starting a new medicine or changing the dose of a medicine. Get help right away if:  You have trouble breathing.  You have trouble swallowing.  You have   muscle pain along with a stiff neck, fever, and vomiting.  You have severe muscle weakness or cannot move part of your body. This information is not intended to replace advice given to you by your health care provider. Make sure you discuss any questions you have with your health care provider. Document Released: 05/18/2006 Document Revised: 01/14/2016 Document Reviewed: 11/16/2015 Elsevier Interactive Patient Education  2018 Elsevier Inc.     IF you received an x-ray today, you will receive an invoice from [facility]. Please contact [facility] at [phone] with questions or concerns regarding your invoice.   IF you received labwork today, you will receive an invoice from [facility]. Please contact [facility] at [phone] with questions or concerns regarding your invoice.   Our billing staff will not be able to assist you with questions regarding bills from these companies.  You will be contacted with the lab results as soon as they are available. The fastest way to get your results is to activate your My Chart account. Instructions are located on the last page of this paperwork. If you have not heard from us regarding the results in 2 weeks, please contact this office.      

## 2017-02-17 NOTE — Progress Notes (Signed)
  MRN: [id number] DOB: 07/18/1990  Subjective:   Dustin Weber is a 26 y.o. male presenting for chief complaint of disability paperwork  Last OV was 02/01/2017 for follow up on myalgia, persistently elevated CK. Patient is currently being worked up by rheumatology, they are planning an EMG study on 02/19/2017. He has been placed on work restrictions and temporary disability. Today, patient reports ongoing bilateral upper arm pain, neck pain. Has difficulty carrying his daughter. Denies swelling, redness, rashes. He needs to have forms completed for disability.  Dustin Weber has a current medication list which includes the following prescription(s): albuterol and cyclobenzaprine. Also has No Known Allergies.  Dustin Weber  has a past medical history of Asthma. Also denies past surgical history.   Objective:   Vitals: BP 126/79 (BP Location: Right Arm, Patient Position: Sitting, Cuff Size: Normal)   Pulse 98   Temp 98.3 F (36.8 C) (Oral)   Resp 16   Ht 5' 6.12" (1.679 m)   Wt 123 lb 6.4 oz (56 kg)   SpO2 97%   BMI 19.85 kg/m   Physical Exam  Constitutional: He is oriented to person, place, and time. He appears well-developed and well-nourished.  Cardiovascular: Normal rate.   Pulmonary/Chest: Effort normal.  Neurological: He is alert and oriented to person, place, and time.   Assessment and Plan :   1. Myalgia 2. Neck pain 3. Bilateral arm pain 4. Elevated CK 5. Non-traumatic rhabdomyolysis 6. Muscle spasm - Continue f/u with rheumatology. Disability forms completed. Follow up with us as needed.  Wallis BambergMario Kiva Norland, PA-C Primary Care at [facility] [phone] 02/17/2017  1:54 PM

## 2017-02-19 ENCOUNTER — Ambulatory Visit (INDEPENDENT_AMBULATORY_CARE_PROVIDER_SITE_OTHER): Payer: Self-pay | Admitting: Neurology

## 2017-02-19 ENCOUNTER — Encounter: Payer: Self-pay | Admitting: Neurology

## 2017-02-19 ENCOUNTER — Ambulatory Visit (INDEPENDENT_AMBULATORY_CARE_PROVIDER_SITE_OTHER): Payer: BLUE CROSS/BLUE SHIELD | Admitting: Neurology

## 2017-02-19 DIAGNOSIS — M791 Myalgia, unspecified site: Secondary | ICD-10-CM

## 2017-02-19 DIAGNOSIS — M6282 Rhabdomyolysis: Secondary | ICD-10-CM | POA: Diagnosis not present

## 2017-02-19 NOTE — Progress Notes (Signed)
Please refer to EMG and nerve conduction study procedure note. 

## 2017-02-19 NOTE — Procedures (Signed)
     HISTORY:  Dustin Weber is a 26 year old gentleman with a history of rhabdomyolysis that occurred in April 2018 following a physical workout. The patient began having some pain and weakness in the muscles of the arms, he did not note significant issues with the legs. His muscle enzyme level went over 43,000. It has now almost returned to normal. He is sent in for EMG evaluation.  NERVE CONDUCTION STUDIES:  Nerve conduction studies were performed on both upper extremities. The distal motor latencies and motor amplitudes for the median and ulnar nerves were within normal limits. The F wave latencies and nerve conduction velocities for these nerves were also normal. The sensory latencies for the median and ulnar nerves were normal.   EMG STUDIES:  EMG study was performed on the right upper extremity:  The first dorsal interosseous muscle reveals 2 to 4 K units with full recruitment. No fibrillations or positive waves were noted. The abductor pollicis brevis muscle reveals 2 to 4 K units with full recruitment. No fibrillations or positive waves were noted. The extensor indicis proprius muscle reveals 1 to 3 K units with full recruitment. No fibrillations or positive waves were noted. The pronator teres muscle reveals 2 to 3 K units with full recruitment. No fibrillations or positive waves were noted. The biceps muscle reveals 1 to 2 K units with full recruitment. No fibrillations or positive waves were noted. The triceps muscle reveals 2 to 4 K units with full recruitment. No fibrillations or positive waves were noted. The anterior deltoid muscle reveals 2 to 3 K units with full recruitment. No fibrillations or positive waves were noted. The cervical paraspinal muscles were tested at 2 levels. No abnormalities of insertional activity were seen at either level tested. There was good relaxation.   IMPRESSION:  Nerve conduction studies done on both upper extremities were unremarkable, no  evidence of a neuropathy is seen. EMG evaluation of the right upper extremity was normal without evidence of a myopathic disorder or myotonia. There is no evidence of an overlying cervical radiculopathy.  Marlan Palau. Keith Gunther Zawadzki MD 02/19/2017 9:24 AM  [facility] [address] [place], [place] [id number]  Phone [phone] Fax [phone]

## 2017-02-21 NOTE — Progress Notes (Signed)
    [facility]    Nerve / Sites Muscle Latency Ref. Amplitude Ref. Rel Amp Segments Distance Velocity Ref. Area    ms ms mV mV %  cm m/s m/s mVms  L Median - APB     Wrist APB 2.8 ?4.4 12.1 ?4.0 100 Wrist - APB 7   40.7     Upper arm APB 6.8  11.6  96 Upper arm - Wrist 23 57 ?49 39.1  R Median - APB     Wrist APB 3.1 ?4.4 15.4 ?4.0 100 Wrist - APB 7   46.2     Upper arm APB 6.7  15.6  101 Upper arm - Wrist 23 65 ?49 46.9  L Ulnar - ADM     Wrist ADM 2.4 ?3.3 8.9 ?6.0 100 Wrist - ADM 7   24.7     B.Elbow ADM 6.0  8.2  92 B.Elbow - Wrist 21 58 ?49 24.0     A.Elbow ADM 7.6  7.4  90.5 A.Elbow - B.Elbow 10 62 ?49 22.6         A.Elbow - Wrist      R Ulnar - ADM     Wrist ADM 2.9 ?3.3 13.9 ?6.0 100 Wrist - ADM 7   31.5     B.Elbow ADM 6.3  13.8  99.4 B.Elbow - Wrist 21 61 ?49 30.6     A.Elbow ADM 7.9  13.6  98.4 A.Elbow - B.Elbow 10 64 ?49 29.4         A.Elbow - Wrist                 SNC    Nerve / Sites Rec. Site Peak Lat Amp Segments Distance    ms V  cm  L Median - Orthodromic (Dig II, Mid palm)     Dig II Wrist 3.2 26 Dig II - Wrist 13  R Median - Orthodromic (Dig II, Mid palm)     Dig II Wrist 2.8 32 Dig II - Wrist 13  L Ulnar - Orthodromic, (Dig V, Mid palm)     Dig V Wrist 2.7 29 Dig V - Wrist 11  R Ulnar - Orthodromic, (Dig V, Mid palm)     Dig V Wrist 2.9 30 Dig V - Wrist 2811             F  Wave    Nerve F Lat Ref.   ms ms  L Median - APB 25.1 ?31.0  L Ulnar - ADM 25.2 ?32.0  R Median - APB 25.2 ?31.0  R Ulnar - ADM 26.1 ?32.0

## 2017-02-27 ENCOUNTER — Ambulatory Visit
Admission: RE | Admit: 2017-02-27 | Discharge: 2017-02-27 | Disposition: A | Discharge status: Home / Self Care | Payer: BLUE CROSS/BLUE SHIELD | Source: Ambulatory Visit | Attending: Urgent Care | Admitting: Urgent Care

## 2017-02-27 DIAGNOSIS — R221 Localized swelling, mass and lump, neck: Secondary | ICD-10-CM

## 2017-04-19 ENCOUNTER — Ambulatory Visit (INDEPENDENT_AMBULATORY_CARE_PROVIDER_SITE_OTHER): Payer: BLUE CROSS/BLUE SHIELD | Admitting: Urgent Care

## 2017-04-19 ENCOUNTER — Encounter: Payer: Self-pay | Admitting: Urgent Care

## 2017-04-19 VITALS — HR 94 | Temp 99.2°F | Ht 66.0 in | Wt 134.0 lb

## 2017-04-19 DIAGNOSIS — M791 Myalgia, unspecified site: Secondary | ICD-10-CM

## 2017-04-19 DIAGNOSIS — R748 Abnormal levels of other serum enzymes: Secondary | ICD-10-CM

## 2017-04-19 NOTE — Patient Instructions (Addendum)
Creatine Kinase Test Why am I having this test? The creatine kinase (CK) test is performed to determine if there has been damage to muscle tissue in your body. This test can be used to help diagnose heart attack, neurologic diseases, or skeletal diseases. Three different forms of CK are present in your body. They are referred to as isoenzymes:  CK-MM is found in your skeletal muscles and heart.  CK-MB is found mostly in your heart.  CK-BB is found mostly in your brain.  What kind of sample is taken? A blood sample is required for this test. It is usually collected by inserting a needle into a vein. How do I prepare for this test? There is no preparation required for this test. Be aware that your health care provider may require blood samples to be taken at regular intervals for up to 1 week. What are the reference ranges? Reference ranges are considered healthy ranges established after testing a large group of healthy people. Reference ranges may vary among different people, labs, and hospitals. It is your responsibility to obtain your test results. Ask the lab or department performing the test when and how you will get your results. The reference ranges for the CK test are as follows: Total CK:  Adult or elderly (values are higher after exercise): ? Male: 55-170 units/L or 55-170 units/L (SI units). ? Male: 30-135 units/L or 30-135 units/L (SI units).  Newborn: 68-580 units/L (SI units). Isoenzymes:  CK-MM: 100%.  CK-MB: 0%.  CK-BB: 0%. What do the results mean? Levels of total CK that are above the reference ranges may indicate injury or diseases affecting the heart, skeletal muscle, or brain. Increased levels of CK-MM isoenzyme may indicate:  Certain diseases affecting the skeletal muscle.  Recent surgery, trauma, or injury.  Conditions that cause convulsions.  Increased levels of CK-MB isoenzyme may indicate:  Recent heart attack.  Other conditions that cause  injury to the heart muscle.  Increased levels of CK-BB isoenzyme may indicate:  Diseases that affect the central nervous system.  Certain psychiatric therapies.  Certain types of cancer.  Injury to the lungs.  Talk with your health care provider to discuss your results, treatment options, and if necessary, the need for more tests. Talk with your health care provider if you have any questions about your results. Talk with your health care provider to discuss your results, treatment options, and if necessary, the need for more tests. Talk with your health care provider if you have any questions about your results. This information is not intended to replace advice given to you by your health care provider. Make sure you discuss any questions you have with your health care provider. Document Released: 07/27/2004 Document Revised: 02/29/2016 Document Reviewed: 11/20/2013 Elsevier Interactive Patient Education  2018 [employer].     IF you received an x-ray today, you will receive an invoice from [facility]. Please contact [facility] at [phone] with questions or concerns regarding your invoice.   IF you received labwork today, you will receive an invoice from [facility]. Please contact [facility] at [phone] with questions or concerns regarding your invoice.   Our billing staff will not be able to assist you with questions regarding bills from these companies.  You will be contacted with the lab results as soon as they are available. The fastest way to get your results is to activate your My Chart account. Instructions are located on the last page of this paperwork. If you have not heard from  [place] regarding the results in 2 weeks, please contact this office.

## 2017-04-19 NOTE — Progress Notes (Signed)
   MRN: [id number] DOB: 1990/07/27  Subjective:   Dustin Weber is a 26 y.o. male presenting for follow up on myalgia. He has been working with rheumatology to work up persistent myalgia and elevated CK levels. Today, he presents for completion of disability forms. Admits improvement in his arm pain but is nervous about going back to work. Rheumatology work up and neurology work up has been negative.  Dustin Weber has a current medication list which includes the following prescription(s): albuterol and cyclobenzaprine. Also has No Known Allergies.  Dustin Weber  has a past medical history of Asthma. Denies past surgical history.  Objective:   Vitals: Pulse 94   Temp 99.2 F (37.3 C) (Oral)   Ht  (1.676 m)   Wt 134 lb (60.8 kg)   SpO2 100%   BMI 21.63 kg/m   Physical Exam  Constitutional: He is oriented to person, place, and time. He appears well-developed and well-nourished.  Cardiovascular: Normal rate.   Pulmonary/Chest: Effort normal.  Neurological: He is alert and oriented to person, place, and time.   Assessment and Plan :   Myalgia - Plan: CK, Basic metabolic panel  Elevated CK - Plan: CK, Basic metabolic panel  Forms for disability completed, estimated return to work date is 05/20/2017. Labs pending. F/u prior to restarting work.  Wallis Bamberg, PA-C [facility] [phone] 04/19/2017 11:59 AM

## 2017-04-20 LAB — BASIC METABOLIC PANEL
BUN/Creatinine Ratio: 19 (ref 9–20)
BUN: 48 mg/dL — ABNORMAL HIGH (ref 6–20)
CO2: 20 mmol/L (ref 20–29)
Calcium: 9.6 mg/dL (ref 8.7–10.2)
Chloride: 107 mmol/L — ABNORMAL HIGH (ref 96–106)
Creatinine, Ser: 2.58 mg/dL — ABNORMAL HIGH (ref 0.76–1.27)
GFR calc Af Amer: 39 mL/min/{1.73_m2} — ABNORMAL LOW (ref >59)
GFR calc non Af Amer: 34 mL/min/{1.73_m2} — ABNORMAL LOW (ref >59)
Glucose: 109 mg/dL — ABNORMAL HIGH (ref 65–99)
Potassium: 4.3 mmol/L (ref 3.5–5.2)
Sodium: 144 mmol/L (ref 134–144)

## 2017-04-20 LAB — CK: Total CK: 84 U/L (ref 24–204)

## 2017-04-24 ENCOUNTER — Ambulatory Visit: Payer: BLUE CROSS/BLUE SHIELD | Admitting: Family Medicine

## 2017-04-26 ENCOUNTER — Ambulatory Visit (INDEPENDENT_AMBULATORY_CARE_PROVIDER_SITE_OTHER): Payer: BLUE CROSS/BLUE SHIELD | Admitting: Family Medicine

## 2017-04-26 ENCOUNTER — Encounter: Payer: Self-pay | Admitting: Family Medicine

## 2017-04-26 VITALS — BP 132/76 | HR 95 | Temp 98.2°F | Resp 18 | Ht 66.0 in | Wt 142.2 lb

## 2017-04-26 DIAGNOSIS — N289 Disorder of kidney and ureter, unspecified: Secondary | ICD-10-CM | POA: Diagnosis not present

## 2017-04-26 LAB — POC MICROSCOPIC URINALYSIS (UMFC): Mucus: ABSENT

## 2017-04-26 LAB — POCT URINALYSIS DIP (MANUAL ENTRY)
Bilirubin, UA: NEGATIVE
Blood, UA: NEGATIVE
Glucose, UA: NEGATIVE mg/dL
Ketones, POC UA: NEGATIVE mg/dL
Leukocytes, UA: NEGATIVE
Nitrite, UA: NEGATIVE
Protein Ur, POC: NEGATIVE mg/dL
Spec Grav, UA: 1.015 (ref 1.010–1.025)
Urobilinogen, UA: 0.2 E.U./dL
pH, UA: 8.5 — AB (ref 5.0–8.0)

## 2017-04-26 NOTE — Patient Instructions (Signed)
     IF you received an x-ray today, you will receive an invoice from [facility]. Please contact  Radiology at [phone] with questions or concerns regarding your invoice.   IF you received labwork today, you will receive an invoice from [facility]. Please contact [facility] at [phone] with questions or concerns regarding your invoice.   Our billing staff will not be able to assist you with questions regarding bills from these companies.  You will be contacted with the lab results as soon as they are available. The fastest way to get your results is to activate your My Chart account. Instructions are located on the last page of this paperwork. If you have not heard from us regarding the results in 2 weeks, please contact this office.     

## 2017-04-26 NOTE — Progress Notes (Signed)
10/18/201810:48 AM  Stephannie LiKaryon A Hommes [birth year], 26 y.o. male [id number]  Chief Complaint  Patient presents with  . Follow-up    on labs     HPI:   Patient is a 26 y.o. male with past medical history significant for rhabdomyolysis in April 2018, CK 40,000s, normal crt, right arm who presents today for fu on labs done a week ago.  He reports he still has some residual RUE pain, specially when he picks up his little girl, it starts hurting quickly. Has stopped exercising, lifting, etc. Pushing fluids aggressively.    [facility] 04/26/2017 04/19/2017 02/01/2017  Decreased Interest 0 0 0  Down, Depressed, Hopeless 0 0 0  PHQ - 2 Score 0 0 0    No Known Allergies  Prior to Admission medications   Medication Sig Start Date End Date Taking? Authorizing Provider  albuterol (PROVENTIL HFA;VENTOLIN HFA) 108 (90 Base) MCG/ACT inhaler Inhale 1-2 puffs into the lungs every 4 (four) hours as needed for wheezing or shortness of breath. 05/29/16  Yes Shade FloodGreene, Jeffrey R, MD  cyclobenzaprine (FLEXERIL) 5 MG tablet Take 1 tablet (5 mg total) by mouth 3 (three) times daily as needed for muscle spasms. 02/17/17  Yes Wallis BambergMani, Mario, PA-C    Past Medical History:  Diagnosis Date  . Asthma     History reviewed. No pertinent surgical history.  Social History  Substance Use Topics  . Smoking status: Current Every Day Smoker    Packs/day: 0.20    Years: 3.00  . Smokeless tobacco: Never Used  . Alcohol use Yes     Comment: occasional    Family History  Problem Relation Age of Onset  . Diabetes Maternal Grandmother     Review of Systems  Constitutional: Negative for chills, diaphoresis, fever and malaise/fatigue.  Respiratory: Negative for cough and shortness of breath.   Cardiovascular: Negative for chest pain and palpitations.  Genitourinary: Negative for dysuria, flank pain, frequency, hematuria and urgency.  Musculoskeletal: Positive for myalgias.      OBJECTIVE:  Blood pressure 132/76, pulse 95, temperature 98.2 F (36.8 C), temperature source Oral, resp. rate 18, height 5\' 6"  (1.676 m), weight 142 lb 3.2 oz (64.5 kg), SpO2 98 %.  Physical Exam  Constitutional: He is oriented to person, place, and time and well-developed, well-nourished, and in no distress.  HENT:  Head: Normocephalic and atraumatic.  Mouth/Throat: Oropharynx is clear and moist.  Eyes: Pupils are equal, round, and reactive to light. EOM are normal.  Neck: Neck supple.  Cardiovascular: Normal rate and regular rhythm.  Exam reveals no gallop and no friction rub.   No murmur heard. Pulmonary/Chest: Effort normal and breath sounds normal. He has no wheezes. He has no rales.  Abdominal: Soft. Bowel sounds are normal. He exhibits no distension and no mass. There is no tenderness. There is no CVA tenderness.  Neurological: He is alert and oriented to person, place, and time. Gait normal.  Skin: Skin is warm and dry.    Recent Results (from the past 2160 hour(s))  POCT urinalysis dipstick     Status: Abnormal   Collection Time: 02/01/17  4:36 PM  Result Value Ref Range   Color, UA yellow yellow   Clarity, UA clear clear   Glucose, UA negative negative mg/dL   Bilirubin, UA negative negative   Ketones, POC UA trace (5) (A) negative mg/dL   Spec Grav, UA 4.0981.025 1.1911.010 - 1.025   Blood, UA negative negative   pH, UA  5.5 5.0 - 8.0   Protein Ur, POC trace (A) negative mg/dL   Urobilinogen, UA 0.2 0.2 or 1.0 E.U./dL   Nitrite, UA Negative Negative   Leukocytes, UA Negative Negative  Basic metabolic panel     Status: Abnormal   Collection Time: 02/01/17  5:19 PM  Result Value Ref Range   Glucose 59 (L) 65 - 99 mg/dL   BUN 19 6 - 20 mg/dL   Creatinine, Ser 1.61 0.76 - 1.27 mg/dL   GFR calc non Af Amer 95 >59 mL/min/1.73   GFR calc Af Amer 110 >59 mL/min/1.73   BUN/Creatinine Ratio 17 9 - 20   Sodium 144 134 - 144 mmol/L   Potassium 4.5 3.5 - 5.2 mmol/L    Chloride 102 96 - 106 mmol/L   CO2 26 20 - 29 mmol/L   Calcium 10.2 8.7 - 10.2 mg/dL  CK     Status: Abnormal   Collection Time: 02/01/17  5:19 PM  Result Value Ref Range   Total CK 264 (H) 24 - 204 U/L  Specimen status report     Status: None   Collection Time: 02/01/17  5:19 PM  Result Value Ref Range   specimen status report Comment     Comment: Written Authorization Written Authorization Written Authorization Received. Authorization received from CRYSTAL CREED 02-07-2017 Logged by Harl Bowie   CK     Status: None   Collection Time: 04/19/17  2:43 PM  Result Value Ref Range   Total CK 84 24 - 204 U/L  Basic metabolic panel     Status: Abnormal   Collection Time: 04/19/17  2:43 PM  Result Value Ref Range   Glucose 109 (H) 65 - 99 mg/dL   BUN 48 (H) 6 - 20 mg/dL   Creatinine, Ser 0.96 (H) 0.76 - 1.27 mg/dL   GFR calc non Af Amer 34 (L) >59 mL/min/1.73   GFR calc Af Amer 39 (L) >59 mL/min/1.73   BUN/Creatinine Ratio 19 9 - 20   Sodium 144 134 - 144 mmol/L   Potassium 4.3 3.5 - 5.2 mmol/L   Chloride 107 (H) 96 - 106 mmol/L   CO2 20 20 - 29 mmol/L   Calcium 9.6 8.7 - 10.2 mg/dL  POCT urinalysis dipstick     Status: Abnormal   Collection Time: 04/26/17 10:46 AM  Result Value Ref Range   Color, UA yellow yellow   Clarity, UA clear clear   Glucose, UA negative negative mg/dL   Bilirubin, UA negative negative   Ketones, POC UA negative negative mg/dL   Spec Grav, UA 0.454 0.981 - 1.025   Blood, UA negative negative   pH, UA 8.5 (A) 5.0 - 8.0   Protein Ur, POC negative negative mg/dL   Urobilinogen, UA 0.2 0.2 or 1.0 E.U./dL   Nitrite, UA Negative Negative   Leukocytes, UA Negative Negative  POCT Microscopic Urinalysis ([facility])     Status: None   Collection Time: 04/26/17 11:30 AM  Result Value Ref Range   WBC,UR,HPF,POC None None WBC/hpf   RBC,UR,HPF,POC None None RBC/hpf   Bacteria None None, Too numerous to count   Mucus Absent Absent   Epithelial Cells, UR Per  Microscopy None None, Too numerous to count cells/hpf    Results for orders placed or performed in visit on 04/26/17 (from the past 24 hour(s))  POCT urinalysis dipstick     Status: Abnormal   Collection Time: 04/26/17 10:46 AM  Result Value Ref Range   Color,  UA yellow yellow   Clarity, UA clear clear   Glucose, UA negative negative mg/dL   Bilirubin, UA negative negative   Ketones, POC UA negative negative mg/dL   Spec Grav, UA 0.981 1.914 - 1.025   Blood, UA negative negative   pH, UA 8.5 (A) 5.0 - 8.0   Protein Ur, POC negative negative mg/dL   Urobilinogen, UA 0.2 0.2 or 1.0 E.U./dL   Nitrite, UA Negative Negative   Leukocytes, UA Negative Negative    No results found.   ASSESSMENT and PLAN  1. Acute renal insufficiency CK normalized but crt increased significantly, Labs from last week. UA dip and microscopy normal today. Repeating BMP, fu pending results.  - POCT urinalysis dipstick - POCT Microscopic Urinalysis ([facility]) - Basic Metabolic Panel  Return if symptoms worsen or fail to improve.    Myles Lipps, MD Primary Care at [facility] [address] [place], [place] [address] Ph.  [phone] Fax [phone]

## 2017-05-01 LAB — BASIC METABOLIC PANEL
BUN/Creatinine Ratio: 17 (ref 9–20)
BUN: 16 mg/dL (ref 6–20)
CO2: 29 mmol/L (ref 20–29)
Calcium: 10.2 mg/dL (ref 8.7–10.2)
Chloride: 98 mmol/L (ref 96–106)
Creatinine, Ser: 0.92 mg/dL (ref 0.76–1.27)
GFR calc Af Amer: 135 mL/min/{1.73_m2} (ref >59)
GFR calc non Af Amer: 117 mL/min/{1.73_m2} (ref >59)
Glucose: 88 mg/dL (ref 65–99)
Potassium: 4.9 mmol/L (ref 3.5–5.2)
Sodium: 140 mmol/L (ref 134–144)

## 2017-05-03 ENCOUNTER — Ambulatory Visit: Payer: BLUE CROSS/BLUE SHIELD | Admitting: Urgent Care

## 2017-05-08 ENCOUNTER — Ambulatory Visit: Payer: BLUE CROSS/BLUE SHIELD | Admitting: Urgent Care

## 2017-05-14 ENCOUNTER — Telehealth: Payer: Self-pay | Admitting: Urgent Care

## 2017-05-14 NOTE — Telephone Encounter (Signed)
Patient has called FMLA voicemail 4 times since Friday 05/11/17 needing his updated disability forms faxed to his employer. I have not seen any forms for this patient but according to Mani's note they were completed on his OV on 04/19/17. Do you know where these forms are because they have not come across my desk. Please let me know thank you!

## 2017-05-15 ENCOUNTER — Ambulatory Visit (INDEPENDENT_AMBULATORY_CARE_PROVIDER_SITE_OTHER): Payer: BLUE CROSS/BLUE SHIELD | Admitting: Urgent Care

## 2017-05-15 ENCOUNTER — Encounter: Payer: Self-pay | Admitting: Urgent Care

## 2017-05-15 VITALS — BP 122/72 | HR 99 | Temp 99.2°F | Resp 17 | Ht 67.5 in | Wt 141.0 lb

## 2017-05-15 DIAGNOSIS — M79601 Pain in right arm: Secondary | ICD-10-CM

## 2017-05-15 DIAGNOSIS — M79602 Pain in left arm: Secondary | ICD-10-CM | POA: Diagnosis not present

## 2017-05-15 DIAGNOSIS — N289 Disorder of kidney and ureter, unspecified: Secondary | ICD-10-CM

## 2017-05-15 DIAGNOSIS — R748 Abnormal levels of other serum enzymes: Secondary | ICD-10-CM

## 2017-05-15 DIAGNOSIS — M791 Myalgia, unspecified site: Secondary | ICD-10-CM | POA: Diagnosis not present

## 2017-05-15 NOTE — Progress Notes (Signed)
   MRN: [id number] DOB: December 16, 1990  Subjective:   Dustin Weber is a 26 y.o. male presenting for follow up on myalgia, elevated CK. Patient was last seen on 04/26/2017, kidney function returned to normal. Last CK level was drawn 04/19/2017, was normal at 84. [facility]. Still has intermittent arm pain, difficulty carrying his daughter. Denies fever, n/v, abdominal pain, flank pain, hematuria, anuria, difficulty urinating. His disability is scheduled to end 05/20/2017.  [name] has a current medication list which includes the following prescription(s): albuterol and cyclobenzaprine. Also has No Known Allergies.  [name]  has a past medical history of Asthma. Denies past surgical history.   Objective:   Vitals: BP 122/72   Pulse 99   Temp 99.2 F (37.3 C) (Oral)   Resp 17   Ht 5' 7.5" (1.715 m)   Wt 141 lb (64 kg)   SpO2 98%   BMI 21.76 kg/m   Physical Exam  Constitutional: He is oriented to person, place, and time. He appears well-developed and well-nourished.  HENT:  Mouth/Throat: Oropharynx is clear and moist.  Eyes: No scleral icterus.  Neck: Normal range of motion. Neck supple. No thyromegaly present.  Cardiovascular: Normal rate, regular rhythm and intact distal pulses. Exam reveals no gallop and no friction rub.  No murmur heard. Pulmonary/Chest: No respiratory distress. He has no wheezes. He has no rales.  Abdominal: Soft. Bowel sounds are normal. He exhibits no distension and no mass. There is no tenderness. There is no guarding.  Neurological: He is alert and oriented to person, place, and time.  Skin: Skin is warm and dry.   Assessment and Plan :   1. Acute renal insufficiency 2. Myalgia 3. Elevated CK 4. Bilateral arm pain - Patient is to hydrate aggressively every day, each potassium rich foods. Return-to-clinic precautions discussed, patient verbalized understanding.   Wallis BambergMario Kingson Lohmeyer, PA-C [facility] [phone] 05/15/2017 9:07 AM

## 2017-05-15 NOTE — Patient Instructions (Addendum)
Eat more foods that contain a lot of potassium, such as: ? Nuts, such as peanuts and pistachios. ? Seeds, such as sunflower seeds and pumpkin seeds. ? Peas, lentils, and lima beans. ? Whole grain and bran cereals and breads. ? Fresh fruits and vegetables, such as apricots, avocado, bananas, cantaloupe, kiwi, oranges, tomatoes, asparagus, and potatoes. ? Orange juice. ? Tomato juice. ? Red meats. ? Yogurt.    IF you received an x-ray today, you will receive an invoice from [facility]. Please contact [facility] at [phone] with questions or concerns regarding your invoice.   IF you received labwork today, you will receive an invoice from [facility]. Please contact [facility] at [phone] with questions or concerns regarding your invoice.   Our billing staff will not be able to assist you with questions regarding bills from these companies.  You will be contacted with the lab results as soon as they are available. The fastest way to get your results is to activate your My Chart account. Instructions are located on the last page of this paperwork. If you have not heard from us regarding the results in 2 weeks, please contact this office.

## 2017-05-17 ENCOUNTER — Ambulatory Visit: Payer: BLUE CROSS/BLUE SHIELD | Admitting: Urgent Care

## 2017-05-18 NOTE — Telephone Encounter (Signed)
Patient was seen in clinic. Work note given. His FMLA is ending. He is to follow up as needed.

## 2017-07-13 ENCOUNTER — Ambulatory Visit: Payer: BLUE CROSS/BLUE SHIELD | Admitting: Urgent Care

## 2017-07-13 ENCOUNTER — Encounter: Payer: Self-pay | Admitting: Urgent Care

## 2017-07-13 VITALS — BP 126/72 | HR 73 | Temp 98.4°F | Resp 16 | Ht 67.5 in | Wt 134.0 lb

## 2017-07-13 DIAGNOSIS — R5381 Other malaise: Secondary | ICD-10-CM

## 2017-07-13 DIAGNOSIS — R05 Cough: Secondary | ICD-10-CM | POA: Diagnosis not present

## 2017-07-13 DIAGNOSIS — J45909 Unspecified asthma, uncomplicated: Secondary | ICD-10-CM | POA: Diagnosis not present

## 2017-07-13 DIAGNOSIS — J069 Acute upper respiratory infection, unspecified: Secondary | ICD-10-CM | POA: Diagnosis not present

## 2017-07-13 DIAGNOSIS — J4520 Mild intermittent asthma, uncomplicated: Secondary | ICD-10-CM

## 2017-07-13 DIAGNOSIS — Z720 Tobacco use: Secondary | ICD-10-CM | POA: Diagnosis not present

## 2017-07-13 DIAGNOSIS — J3489 Other specified disorders of nose and nasal sinuses: Secondary | ICD-10-CM | POA: Diagnosis not present

## 2017-07-13 DIAGNOSIS — R059 Cough, unspecified: Secondary | ICD-10-CM

## 2017-07-13 LAB — POCT RAPID STREP A (OFFICE): Rapid Strep A Screen: NEGATIVE

## 2017-07-13 MED ORDER — BENZONATATE 100 MG PO CAPS: 100.0000 mg | ORAL_CAPSULE | Freq: Three times a day (TID) | ORAL | 0 refills | Status: DC | PRN

## 2017-07-13 MED ORDER — ALBUTEROL SULFATE HFA 108 (90 BASE) MCG/ACT IN AERS: 1.0000 | INHALATION_SPRAY | Freq: Four times a day (QID) | RESPIRATORY_TRACT | 0 refills | Status: DC | PRN

## 2017-07-13 MED ORDER — AZITHROMYCIN 250 MG PO TABS: ORAL_TABLET | ORAL | 0 refills | Status: DC

## 2017-07-13 MED ORDER — CETIRIZINE HCL 10 MG PO TABS: 10.0000 mg | ORAL_TABLET | Freq: Every day | ORAL | 1 refills | Status: DC

## 2017-07-13 MED ORDER — HYDROCODONE-HOMATROPINE 5-1.5 MG/5ML PO SYRP: 5.0000 mL | ORAL_SOLUTION | Freq: Every evening | ORAL | 0 refills | Status: DC | PRN

## 2017-07-13 NOTE — Progress Notes (Signed)
  MRN: [id number] DOB: September 26, 1990  Subjective:   Dustin Weber is a 27 y.o. male presenting for 2 week history of dry cough that elicits wheezing, flank pain, headache. Had one episode of post-tussive emesis this morning. He also had bilateral ear pain. Has been using Zyrtec, albuterol inhaler. Denies fever, sore throat, chest pain, shob, abdominal pain. Patient is working on quitting smoking.  Dustin Weber has a current medication list which includes the following prescription(s): albuterol and cyclobenzaprine. Also has No Known Allergies.  Dustin Weber  has a past medical history of Asthma. Denies past surgical history.  Objective:   Vitals: BP 126/72   Pulse 73   Temp 98.4 F (36.9 C) (Oral)   Resp 16   Ht 5' 7.5" (1.715 m)   Wt 134 lb (60.8 kg)   SpO2 99%   BMI 20.68 kg/m   Physical Exam  Constitutional: He is oriented to person, place, and time. He appears well-developed and well-nourished.  Cardiovascular: Normal rate, regular rhythm and intact distal pulses. Exam reveals no gallop and no friction rub.  No murmur heard. Pulmonary/Chest: No respiratory distress. He has no wheezes. He has no rales.  Neurological: He is alert and oriented to person, place, and time.   Results for orders placed or performed in visit on 07/13/17 (from the past 24 hour(s))  POCT rapid strep A     Status: None   Collection Time: 07/13/17  9:09 AM  Result Value Ref Range   Rapid Strep A Screen Negative Negative    Assessment and Plan :   Acute upper respiratory infection  Malaise  Cough - Plan: POCT rapid strep A  Sinus pain  Uncomplicated asthma, unspecified asthma severity, unspecified whether persistent  Mild intermittent asthma, unspecified whether complicated - Plan: albuterol (PROVENTIL HFA;VENTOLIN HFA) 108 (90 Base) MCG/ACT inhaler  Tobacco use   Recommended supportive care for viral respiratory infection. Provided patient with script for azithromycin in case his symptoms don't improve  with supportive care, smoking cessation, rest and hydration. Return-to-clinic precautions discussed, patient verbalized understanding.   [name], PA-C [facility] Care at [facility] [phone] 07/13/2017  8:52 AM

## 2017-07-13 NOTE — Patient Instructions (Signed)
Cough, Adult A cough helps to clear your throat and lungs. A cough may last only 2-3 weeks (acute), or it may last longer than 8 weeks (chronic). Many different things can cause a cough. A cough may be a sign of an illness or another medical condition. Follow these instructions at home:  Pay attention to any changes in your cough.  Take medicines only as told by your doctor. ? If you were prescribed an antibiotic medicine, take it as told by your doctor. Do not stop taking it even if you start to feel better. ? Talk with your doctor before you try using a cough medicine.  Drink enough fluid to keep your pee (urine) clear or pale yellow.  If the air is dry, use a cold steam vaporizer or humidifier in your home.  Stay away from things that make you cough at work or at home.  If your cough is worse at night, try using extra pillows to raise your head up higher while you sleep.  Do not smoke, and try not to be around smoke. If you need help quitting, ask your doctor.  Do not have caffeine.  Do not drink alcohol.  Rest as needed. Contact a doctor if:  You have new problems (symptoms).  You cough up yellow fluid (pus).  Your cough does not get better after 2-3 weeks, or your cough gets worse.  Medicine does not help your cough and you are not sleeping well.  You have pain that gets worse or pain that is not helped with medicine.  You have a fever.  You are losing weight and you do not know why.  You have night sweats. Get help right away if:  You cough up blood.  You have trouble breathing.  Your heartbeat is very fast. This information is not intended to replace advice given to you by your health care provider. Make sure you discuss any questions you have with your health care provider. Document Released: 03/09/2011 Document Revised: 12/02/2015 Document Reviewed: 09/02/2014 Elsevier Interactive Patient Education  2018 Elsevier Inc.     IF you received an x-ray  today, you will receive an invoice from [facility]. Please contact [facility] at [phone] with questions or concerns regarding your invoice.   IF you received labwork today, you will receive an invoice from [facility]. Please contact [facility] at [phone] with questions or concerns regarding your invoice.   Our billing staff will not be able to assist you with questions regarding bills from these companies.  You will be contacted with the lab results as soon as they are available. The fastest way to get your results is to activate your My Chart account. Instructions are located on the last page of this paperwork. If you have not heard from us regarding the results in 2 weeks, please contact this office.      

## 2017-07-20 ENCOUNTER — Encounter: Payer: Self-pay | Admitting: Urgent Care

## 2017-07-20 ENCOUNTER — Ambulatory Visit: Payer: BLUE CROSS/BLUE SHIELD | Admitting: Urgent Care

## 2017-07-20 VITALS — BP 132/74 | HR 70 | Temp 97.4°F | Resp 17 | Ht 67.5 in | Wt 141.0 lb

## 2017-07-20 DIAGNOSIS — J45909 Unspecified asthma, uncomplicated: Secondary | ICD-10-CM

## 2017-07-20 DIAGNOSIS — R059 Cough, unspecified: Secondary | ICD-10-CM

## 2017-07-20 DIAGNOSIS — R05 Cough: Secondary | ICD-10-CM | POA: Diagnosis not present

## 2017-07-20 DIAGNOSIS — J069 Acute upper respiratory infection, unspecified: Secondary | ICD-10-CM | POA: Diagnosis not present

## 2017-07-20 DIAGNOSIS — M25512 Pain in left shoulder: Secondary | ICD-10-CM

## 2017-07-20 MED ORDER — HYDROCODONE-HOMATROPINE 5-1.5 MG/5ML PO SYRP: 5.0000 mL | ORAL_SOLUTION | Freq: Every evening | ORAL | 0 refills | Status: DC | PRN

## 2017-07-20 MED ORDER — BENZONATATE 100 MG PO CAPS: 100.0000 mg | ORAL_CAPSULE | Freq: Three times a day (TID) | ORAL | 0 refills | Status: DC | PRN

## 2017-07-20 NOTE — Patient Instructions (Addendum)
Cough, Adult Coughing is a reflex that clears your throat and your airways. Coughing helps to heal and protect your lungs. It is normal to cough occasionally, but a cough that happens with other symptoms or lasts a long time may be a sign of a condition that needs treatment. A cough may last only 2-3 weeks (acute), or it may last longer than 8 weeks (chronic). What are the causes? Coughing is commonly caused by:  Breathing in substances that irritate your lungs.  A viral or bacterial respiratory infection.  Allergies.  Asthma.  Postnasal drip.  Smoking.  Acid backing up from the stomach into the esophagus (gastroesophageal reflux).  Certain medicines.  Chronic lung problems, including COPD (or rarely, lung cancer).  Other medical conditions such as heart failure.  Follow these instructions at home: Pay attention to any changes in your symptoms. Take these actions to help with your discomfort:  Take medicines only as told by your health care provider. ? If you were prescribed an antibiotic medicine, take it as told by your health care provider. Do not stop taking the antibiotic even if you start to feel better. ? Talk with your health care provider before you take a cough suppressant medicine.  Drink enough fluid to keep your urine clear or pale yellow.  If the air is dry, use a cold steam vaporizer or humidifier in your bedroom or your home to help loosen secretions.  Avoid anything that causes you to cough at work or at home.  If your cough is worse at night, try sleeping in a semi-upright position.  Avoid cigarette smoke. If you smoke, quit smoking. If you need help quitting, ask your health care provider.  Avoid caffeine.  Avoid alcohol.  Rest as needed.  Contact a health care provider if:  You have new symptoms.  You cough up pus.  Your cough does not get better after 2-3 weeks, or your cough gets worse.  You cannot control your cough with suppressant  medicines and you are losing sleep.  You develop pain that is getting worse or pain that is not controlled with pain medicines.  You have a fever.  You have unexplained weight loss.  You have night sweats. Get help right away if:  You cough up blood.  You have difficulty breathing.  Your heartbeat is very fast. This information is not intended to replace advice given to you by your health care provider. Make sure you discuss any questions you have with your health care provider. Document Released: 12/23/2010 Document Revised: 12/02/2015 Document Reviewed: 09/02/2014 Elsevier Interactive Patient Education  2018 Elsevier Inc.     IF you received an x-ray today, you will receive an invoice from [facility]. Please contact [facility] at [phone] with questions or concerns regarding your invoice.   IF you received labwork today, you will receive an invoice from [facility]. Please contact [facility] at [phone] with questions or concerns regarding your invoice.   Our billing staff will not be able to assist you with questions regarding bills from these companies.  You will be contacted with the lab results as soon as they are available. The fastest way to get your results is to activate your My Chart account. Instructions are located on the last page of this paperwork. If you have not heard from us regarding the results in 2 weeks, please contact this office.      

## 2017-07-20 NOTE — Progress Notes (Signed)
    MRN: [id number] DOB: 12/09/1990  Subjective:   Dustin Weber is a 27 y.o. male presenting for follow up on acute URI. Reports improvement in his symptoms. Still has residual cough but overall feels much better. He did not receive azithromycin, tessalon capsules from the pharmacy for an unknown reason. He also did not have cough syrup filled. Denies fever, sinus pain, ear pain, sore throat, chest pain, n/v, abdominal pain. His shoulder pain is also improved.   Dustin Weber has a current medication list which includes the following prescription(s): albuterol, cetirizine, cyclobenzaprine, azithromycin, benzonatate, and hydrocodone-homatropine. Also has No Known Allergies.  Dustin Weber  has a past medical history of Asthma. Also  has no past surgical history on file.  Objective:   Vitals: BP 132/74   Pulse 70   Temp (!) 97.4 F (36.3 C) (Oral)   Resp 17   Ht 5' 7.5" (1.715 m)   Wt 141 lb (64 kg)   SpO2 98%   BMI 21.76 kg/m   Physical Exam  Constitutional: He is oriented to person, place, and time. He appears well-developed and well-nourished.  Cardiovascular: Normal rate, regular rhythm and intact distal pulses. Exam reveals no gallop and no friction rub.  No murmur heard. Pulmonary/Chest: No respiratory distress. He has no wheezes. He has no rales.  Neurological: He is alert and oriented to person, place, and time.  Psychiatric: He has a normal mood and affect.   Assessment and Plan :   Acute upper respiratory infection  Cough  Uncomplicated asthma, unspecified asthma severity, unspecified whether persistent  Acute pain of left shoulder  Improved, provided patient with written scripts for Tessalon and Hycodan for cough suppression therapy. Follow up as needed.  Dustin BambergMario Tandra Rosado, PA-C [facility] [phone] 07/20/2017 9:06 AM

## 2017-07-24 ENCOUNTER — Ambulatory Visit: Payer: BLUE CROSS/BLUE SHIELD | Admitting: Urgent Care

## 2017-07-24 ENCOUNTER — Encounter: Payer: Self-pay | Admitting: Urgent Care

## 2017-07-24 ENCOUNTER — Other Ambulatory Visit: Payer: Self-pay

## 2017-07-24 VITALS — BP 122/80 | HR 88 | Temp 98.8°F | Resp 16 | Ht 67.32 in | Wt 140.0 lb

## 2017-07-24 DIAGNOSIS — R1011 Right upper quadrant pain: Secondary | ICD-10-CM | POA: Diagnosis not present

## 2017-07-24 DIAGNOSIS — R109 Unspecified abdominal pain: Secondary | ICD-10-CM

## 2017-07-24 LAB — POCT CBC
Granulocyte percent: 57.8 %G (ref 37–80)
HCT, POC: 43.9 % (ref 43.5–53.7)
Hemoglobin: 14.2 g/dL (ref 14.1–18.1)
Lymph, poc: 1.5 (ref 0.6–3.4)
MCH, POC: 24.1 pg — AB (ref 27–31.2)
MCHC: 32.4 g/dL (ref 31.8–35.4)
MCV: 74.5 fL — AB (ref 80–97)
MID (cbc): 0.4 (ref 0–0.9)
MPV: 8.2 fL (ref 0–99.8)
POC Granulocyte: 2.6 (ref 2–6.9)
POC LYMPH PERCENT: 34.1 %L (ref 10–50)
POC MID %: 8.1 %M (ref 0–12)
Platelet Count, POC: 298 10*3/uL (ref 142–424)
RBC: 5.9 M/uL (ref 4.69–6.13)
RDW, POC: 13.7 %
WBC: 4.5 10*3/uL — AB (ref 4.6–10.2)

## 2017-07-24 LAB — POCT URINALYSIS DIP (MANUAL ENTRY)
Bilirubin, UA: NEGATIVE
Blood, UA: NEGATIVE
Glucose, UA: NEGATIVE mg/dL
Ketones, POC UA: NEGATIVE mg/dL
Leukocytes, UA: NEGATIVE
Nitrite, UA: NEGATIVE
Protein Ur, POC: NEGATIVE mg/dL
Spec Grav, UA: 1.02 (ref 1.010–1.025)
Urobilinogen, UA: 0.2 E.U./dL
pH, UA: 5.5 (ref 5.0–8.0)

## 2017-07-24 MED ORDER — TRAMADOL-ACETAMINOPHEN 37.5-325 MG PO TABS: 1.0000 | ORAL_TABLET | Freq: Three times a day (TID) | ORAL | 0 refills | Status: DC | PRN

## 2017-07-24 NOTE — Progress Notes (Signed)
MRN: [id number] DOB: 23-Aug-1990  Subjective:   Dustin Weber is a 27 y.o. male presenting for 1 day history of epigastric, RUQ pain. Pain is achy, sharp in nature, worse with bearing down, bending down. Worse with coughing. Has tried APAP, Flexeril, Tessalon with minimal relief. Has had occasional episodes of this pain for the past year which resolve on their own. Patient's twin brother has had cholecystectomy and patient is wondering if he may also have gall bladder issues. Denies fever, n/v, diarrhea, constipation, bloody stools. Denies smoking cigarettes.  Dustin Weber has a current medication list which includes the following prescription(s): albuterol, azithromycin, benzonatate, cetirizine, cyclobenzaprine, and hydrocodone-homatropine. Also has No Known Allergies.  Dustin Weber  has a past medical history of Asthma. Denies past surgical history.  Objective:   Vitals: BP 122/80   Pulse 88   Temp 98.8 F (37.1 C) (Oral)   Resp 16   Ht 5' 7.32" (1.71 m)   Wt 140 lb (63.5 kg)   SpO2 99%   BMI 21.72 kg/m   Physical Exam  Constitutional: He is oriented to person, place, and time. He appears well-developed and well-nourished.  Cardiovascular: Normal rate, regular rhythm and intact distal pulses. Exam reveals no gallop and no friction rub.  No murmur heard. Pulmonary/Chest: No respiratory distress. He has no wheezes. He has no rales.  Abdominal: Soft. Bowel sounds are normal. He exhibits no distension and no mass. There is tenderness (mild over RUQ). There is no guarding.  No CVA tenderness.  Neurological: He is alert and oriented to person, place, and time.  Skin: Skin is warm and dry.  Psychiatric: He has a normal mood and affect.    Results for orders placed or performed in visit on 07/24/17 (from the past 24 hour(s))  POCT urinalysis dipstick     Status: None   Collection Time: 07/24/17  4:49 PM  Result Value Ref Range   Color, UA yellow yellow   Clarity, UA clear clear   Glucose,  UA negative negative mg/dL   Bilirubin, UA negative negative   Ketones, POC UA negative negative mg/dL   Spec Grav, UA 4.098 1.191 - 1.025   Blood, UA negative negative   pH, UA 5.5 5.0 - 8.0   Protein Ur, POC negative negative mg/dL   Urobilinogen, UA 0.2 0.2 or 1.0 E.U./dL   Nitrite, UA Negative Negative   Leukocytes, UA Negative Negative  POCT CBC     Status: Abnormal   Collection Time: 07/24/17  4:57 PM  Result Value Ref Range   WBC 4.5 (A) 4.6 - 10.2 K/uL   Lymph, poc 1.5 0.6 - 3.4   POC LYMPH PERCENT 34.1 10 - 50 %L   MID (cbc) 0.4 0 - 0.9   POC MID % 8.1 0 - 12 %M   POC Granulocyte 2.6 2 - 6.9   Granulocyte percent 57.8 37 - 80 %G   RBC 5.90 4.69 - 6.13 M/uL   Hemoglobin 14.2 14.1 - 18.1 g/dL   HCT, POC 47.8 29.5 - 53.7 %   MCV 74.5 (A) 80 - 97 fL   MCH, POC 24.1 (A) 27 - 31.2 pg   MCHC 32.4 31.8 - 35.4 g/dL   RDW, POC 62.1 %   Platelet Count, POC 298 142 - 424 K/uL   MPV 8.2 0 - 99.8 fL   Assessment and Plan :   Abdominal pain, unspecified abdominal location - Plan: POCT urinalysis dipstick, POCT CBC, H. pylori breath test  RUQ pain -  Plan: Comprehensive metabolic panel, US Abdomen Limited RUQ, CANCELED: US Abdomen Limited RUQ  Labs pending, will pursue RUQ U/S. Use APAP, Ultracet for pain and inflammation. ER and return-to-clinic precautions discussed, patient verbalized understanding.   Wallis BambergMario Lee-Ann Gal, PA-C [facility] [phone] 07/24/2017  4:47 PM

## 2017-07-24 NOTE — Patient Instructions (Addendum)
You may take 500mg  Tylenol every 6 hours for pain and inflammation. If your pain is not controlled with Tylenol, then use Ultracet every 8 hours as needed.    Abdominal Pain, Adult Abdominal pain can be caused by many things. Often, abdominal pain is not serious and it gets better with no treatment or by being treated at home. However, sometimes abdominal pain is serious. Your health care provider will do a medical history and a physical exam to try to determine the cause of your abdominal pain. Follow these instructions at home:  Take over-the-counter and prescription medicines only as told by your health care provider. Do not take a laxative unless told by your health care provider.  Drink enough fluid to keep your urine clear or pale yellow.  Watch your condition for any changes.  Keep all follow-up visits as told by your health care provider. This is important. Contact a health care provider if:  Your abdominal pain changes or gets worse.  You are not hungry or you lose weight without trying.  You are constipated or have diarrhea for more than 2-3 days.  You have pain when you urinate or have a bowel movement.  Your abdominal pain wakes you up at night.  Your pain gets worse with meals, after eating, or with certain foods.  You are throwing up and cannot keep anything down.  You have a fever. Get help right away if:  Your pain does not go away as soon as your health care provider told you to expect.  You cannot stop throwing up.  Your pain is only in areas of the abdomen, such as the right side or the left lower portion of the abdomen.  You have bloody or black stools, or stools that look like tar.  You have severe pain, cramping, or bloating in your abdomen.  You have signs of dehydration, such as: ? Dark urine, very little urine, or no urine. ? Cracked lips. ? Dry mouth. ? Sunken eyes. ? Sleepiness. ? Weakness. This information is not intended to replace  advice given to you by your health care provider. Make sure you discuss any questions you have with your health care provider. Document Released: 04/05/2005 Document Revised: 01/14/2016 Document Reviewed: 12/08/2015 Elsevier Interactive Patient Education  2018 [employer]    IF you received an x-ray today, you will receive an invoice from [facility]. Please contact [facility] at [phone] with questions or concerns regarding your invoice.   IF you received labwork today, you will receive an invoice from [facility]. Please contact [facility] at [phone] with questions or concerns regarding your invoice.   Our billing staff will not be able to assist you with questions regarding bills from these companies.  You will be contacted with the lab results as soon as they are available. The fastest way to get your results is to activate your My Chart account. Instructions are located on the last page of this paperwork. If you have not heard from us regarding the results in 2 weeks, please contact this office.

## 2017-07-25 LAB — COMPREHENSIVE METABOLIC PANEL
ALT: 104 IU/L — ABNORMAL HIGH (ref 0–44)
AST: 125 IU/L — ABNORMAL HIGH (ref 0–40)
Albumin/Globulin Ratio: 2.1 (ref 1.2–2.2)
Albumin: 4.9 g/dL (ref 3.5–5.5)
Alkaline Phosphatase: 119 IU/L — ABNORMAL HIGH (ref 39–117)
BUN/Creatinine Ratio: 13 (ref 9–20)
BUN: 14 mg/dL (ref 6–20)
Bilirubin Total: 0.3 mg/dL (ref 0.0–1.2)
CO2: 25 mmol/L (ref 20–29)
Calcium: 9.7 mg/dL (ref 8.7–10.2)
Chloride: 101 mmol/L (ref 96–106)
Creatinine, Ser: 1.05 mg/dL (ref 0.76–1.27)
GFR calc Af Amer: 114 mL/min/{1.73_m2} (ref >59)
GFR calc non Af Amer: 99 mL/min/{1.73_m2} (ref >59)
Globulin, Total: 2.3 g/dL (ref 1.5–4.5)
Glucose: 95 mg/dL (ref 65–99)
Potassium: 5.5 mmol/L — ABNORMAL HIGH (ref 3.5–5.2)
Sodium: 143 mmol/L (ref 134–144)
Total Protein: 7.2 g/dL (ref 6.0–8.5)

## 2017-07-26 LAB — H. PYLORI BREATH TEST: H pylori Breath Test: NEGATIVE

## 2017-08-01 ENCOUNTER — Ambulatory Visit
Admission: RE | Admit: 2017-08-01 | Discharge: 2017-08-01 | Disposition: A | Discharge status: Home / Self Care | Payer: BLUE CROSS/BLUE SHIELD | Source: Ambulatory Visit | Attending: Urgent Care | Admitting: Urgent Care

## 2017-08-01 ENCOUNTER — Telehealth: Payer: Self-pay | Admitting: Neurology

## 2017-08-01 DIAGNOSIS — R1011 Right upper quadrant pain: Secondary | ICD-10-CM

## 2017-08-01 NOTE — Telephone Encounter (Signed)
Called and spoke with patient. Advised he can stop back in the office and sign release form to receive copy of [facility] report done last August. This will include the test he had done and results of test. He verbalized understanding and appreciation for call.

## 2017-08-04 ENCOUNTER — Other Ambulatory Visit: Payer: Self-pay | Admitting: Urgent Care

## 2017-08-04 DIAGNOSIS — J4520 Mild intermittent asthma, uncomplicated: Secondary | ICD-10-CM

## 2017-08-14 ENCOUNTER — Encounter: Payer: Self-pay | Admitting: Urgent Care

## 2017-08-14 ENCOUNTER — Ambulatory Visit: Payer: BLUE CROSS/BLUE SHIELD | Admitting: Urgent Care

## 2017-08-14 VITALS — BP 143/68 | HR 77 | Temp 98.0°F | Resp 16 | Ht 67.0 in

## 2017-08-14 DIAGNOSIS — R1011 Right upper quadrant pain: Secondary | ICD-10-CM | POA: Diagnosis not present

## 2017-08-14 DIAGNOSIS — R748 Abnormal levels of other serum enzymes: Secondary | ICD-10-CM

## 2017-08-14 DIAGNOSIS — M62838 Other muscle spasm: Secondary | ICD-10-CM

## 2017-08-14 DIAGNOSIS — E875 Hyperkalemia: Secondary | ICD-10-CM

## 2017-08-14 DIAGNOSIS — R109 Unspecified abdominal pain: Secondary | ICD-10-CM

## 2017-08-14 MED ORDER — CYCLOBENZAPRINE HCL 5 MG PO TABS: 5.0000 mg | ORAL_TABLET | Freq: Three times a day (TID) | ORAL | 5 refills | Status: DC | PRN

## 2017-08-14 NOTE — Patient Instructions (Addendum)
Abdominal Pain, Adult Abdominal pain can be caused by many things. Often, abdominal pain is not serious and it gets better with no treatment or by being treated at home. However, sometimes abdominal pain is serious. Your health care provider will do a medical history and a physical exam to try to determine the cause of your abdominal pain. Follow these instructions at home:  Take over-the-counter and prescription medicines only as told by your health care provider. Do not take a laxative unless told by your health care provider.  Drink enough fluid to keep your urine clear or pale yellow.  Watch your condition for any changes.  Keep all follow-up visits as told by your health care provider. This is important. Contact a health care provider if:  Your abdominal pain changes or gets worse.  You are not hungry or you lose weight without trying.  You are constipated or have diarrhea for more than 2-3 days.  You have pain when you urinate or have a bowel movement.  Your abdominal pain wakes you up at night.  Your pain gets worse with meals, after eating, or with certain foods.  You are throwing up and cannot keep anything down.  You have a fever. Get help right away if:  Your pain does not go away as soon as your health care provider told you to expect.  You cannot stop throwing up.  Your pain is only in areas of the abdomen, such as the right side or the left lower portion of the abdomen.  You have bloody or black stools, or stools that look like tar.  You have severe pain, cramping, or bloating in your abdomen.  You have signs of dehydration, such as: ? Dark urine, very little urine, or no urine. ? Cracked lips. ? Dry mouth. ? Sunken eyes. ? Sleepiness. ? Weakness. This information is not intended to replace advice given to you by your health care provider. Make sure you discuss any questions you have with your health care provider. Document Released: 04/05/2005 Document  Revised: 01/14/2016 Document Reviewed: 12/08/2015 Elsevier Interactive Patient Education  2018 [employer]     IF you received an x-ray today, you will receive an invoice from [facility]. Please contact [facility] at [phone] with questions or concerns regarding your invoice.   IF you received labwork today, you will receive an invoice from [facility]. Please contact [facility] at [phone] with questions or concerns regarding your invoice.   Our billing staff will not be able to assist you with questions regarding bills from these companies.  You will be contacted with the lab results as soon as they are available. The fastest way to get your results is to activate your My Chart account. Instructions are located on the last page of this paperwork. If you have not heard from us regarding the results in 2 weeks, please contact this office.

## 2017-08-14 NOTE — Progress Notes (Signed)
   MRN: [id number] DOB: August 09, 1990  Subjective:   Dustin Weber is a 27 y.o. male presenting for follow up on abdominal pain. Still has achy, sharp, dull pains over right side, flank side with associated nausea but no vomiting. Food does not make a difference, occurs randomly and can last for ~15 minutes, has multiple episodes in a day. Patient saw his rheumatologist, had normal findings. Denies fever, vomiting, bloody stools, hematuria, diarrhea, constipation. Smokes marijuana 7 times per week. Has occasional alcohol drink.  Jerrion has a current medication list which includes the following prescription(s): albuterol, cyclobenzaprine, and tramadol-acetaminophen. Also has No Known Allergies.  [place]  has a past medical history of Asthma. Denies past surgical history.  Objective:   Vitals: BP (!) 143/68   Pulse 77   Temp 98 F (36.7 C) (Oral)   Resp 16   Ht 5\' 7"  (1.702 m)   SpO2 98%   BMI 21.93 kg/m   Physical Exam  Constitutional: He is oriented to person, place, and time. He appears well-developed and well-nourished.  HENT:  Mouth/Throat: Oropharynx is clear and moist.  Cardiovascular: Normal rate, regular rhythm and intact distal pulses. Exam reveals no gallop and no friction rub.  No murmur heard. Pulmonary/Chest: No respiratory distress. He has no wheezes. He has no rales.  Abdominal: Soft. Bowel sounds are normal. He exhibits no distension and no mass. There is no tenderness. There is no guarding.  Neurological: He is alert and oriented to person, place, and time.  Skin: Skin is warm and dry.  Psychiatric: He has a normal mood and affect.   Assessment and Plan :   RUQ pain - Plan: Lipase, Ambulatory referral to Gastroenterology  Abdominal pain, unspecified abdominal location - Plan: Lipase, Ambulatory referral to Gastroenterology  Elevated liver enzymes - Plan: Ambulatory referral to Gastroenterology, Hepatitis B surface antigen, Hepatitis C antibody  Hyperkalemia -  Plan: Ambulatory referral to Gastroenterology  Muscle spasm - Plan: cyclobenzaprine (FLEXERIL) 5 MG tablet  Will refer to GI for consult on his persistent belly pain. Labs pending. Return-to-clinic precautions discussed, patient verbalized understanding.   Wallis BambergMario Ilynn Stauffer, PA-C [facility] [phone] 08/14/2017 12:19 PM

## 2017-08-15 LAB — HEPATITIS C ANTIBODY: Hep C Virus Ab: 0.5 s/co ratio (ref 0.0–0.9)

## 2017-08-15 LAB — HEPATITIS B SURFACE ANTIGEN: Hepatitis B Surface Ag: NEGATIVE

## 2017-08-15 LAB — LIPASE: Lipase: 33 U/L (ref 13–78)

## 2017-09-11 ENCOUNTER — Other Ambulatory Visit: Payer: Self-pay

## 2017-09-11 ENCOUNTER — Encounter: Payer: Self-pay | Admitting: Urgent Care

## 2017-09-11 ENCOUNTER — Ambulatory Visit: Payer: BLUE CROSS/BLUE SHIELD | Admitting: Urgent Care

## 2017-09-11 ENCOUNTER — Ambulatory Visit (INDEPENDENT_AMBULATORY_CARE_PROVIDER_SITE_OTHER): Payer: BLUE CROSS/BLUE SHIELD

## 2017-09-11 VITALS — BP 137/79 | HR 87 | Temp 98.5°F | Resp 16 | Ht 67.0 in | Wt 140.0 lb

## 2017-09-11 DIAGNOSIS — Z23 Encounter for immunization: Secondary | ICD-10-CM

## 2017-09-11 DIAGNOSIS — R0781 Pleurodynia: Secondary | ICD-10-CM

## 2017-09-11 DIAGNOSIS — R1011 Right upper quadrant pain: Secondary | ICD-10-CM

## 2017-09-11 NOTE — Patient Instructions (Addendum)
Abdominal Pain, Adult Many things can cause belly (abdominal) pain. Most times, belly pain is not dangerous. Many cases of belly pain can be watched and treated at home. Sometimes belly pain is serious, though. Your doctor will try to find the cause of your belly pain. Follow these instructions at home:  Take over-the-counter and prescription medicines only as told by your doctor. Do not take medicines that help you poop (laxatives) unless told to by your doctor.  Drink enough fluid to keep your pee (urine) clear or pale yellow.  Watch your belly pain for any changes.  Keep all follow-up visits as told by your doctor. This is important. Contact a doctor if:  Your belly pain changes or gets worse.  You are not hungry, or you lose weight without trying.  You are having trouble pooping (constipated) or have watery poop (diarrhea) for more than 2-3 days.  You have pain when you pee or poop.  Your belly pain wakes you up at night.  Your pain gets worse with meals, after eating, or with certain foods.  You are throwing up and cannot keep anything down.  You have a fever. Get help right away if:  Your pain does not go away as soon as your doctor says it should.  You cannot stop throwing up.  Your pain is only in areas of your belly, such as the right side or the left lower part of the belly.  You have bloody or black poop, or poop that looks like tar.  You have very bad pain, cramping, or bloating in your belly.  You have signs of not having enough fluid or water in your body (dehydration), such as: ? Dark pee, very little pee, or no pee. ? Cracked lips. ? Dry mouth. ? Sunken eyes. ? Sleepiness. ? Weakness. This information is not intended to replace advice given to you by your health care provider. Make sure you discuss any questions you have with your health care provider. Document Released: 12/13/2007 Document Revised: 01/14/2016 Document Reviewed: 12/08/2015 Elsevier  Interactive Patient Education  2018 [employer]       IF you received an x-ray today, you will receive an invoice from [facility]. Please contact [facility] at [phone] with questions or concerns regarding your invoice.   IF you received labwork today, you will receive an invoice from [facility]. Please contact [facility] at [phone] with questions or concerns regarding your invoice.   Our billing staff will not be able to assist you with questions regarding bills from these companies.  You will be contacted with the lab results as soon as they are available. The fastest way to get your results is to activate your My Chart account. Instructions are located on the last page of this paperwork. If you have not heard from us regarding the results in 2 weeks, please contact this office.

## 2017-09-11 NOTE — Progress Notes (Signed)
    MRN: [id number] DOB: 12-02-1990  Subjective:   Dustin Weber is a 27 y.o. male presenting for follow up on RUQ pain. His RUQ pain is coming and going, not worsening but not resolving either. Has had this multiple times a week since his last OV. Has tested negative for hepatitis, H. pylori, had normal RUQ U/S. Also had pleuritic pain over his back when taking a deep breath this past week. Denies fever, n/v, sore throat, cough, chest pain, shob, wheezing, bloody stools, jaundice. Denies alcohol use. He quit smoking.   Dustin Weber has a current medication list which includes the following prescription(s): albuterol, cyclobenzaprine, and tramadol-acetaminophen. Also has No Known Allergies.  Dustin Weber  has a past medical history of Asthma. Denies past surgical history.   Objective:   Vitals: BP 137/79   Pulse 87   Temp 98.5 F (36.9 C) (Oral)   Resp 16   Ht 5\' 7"  (1.702 m)   Wt 140 lb (63.5 kg)   SpO2 100%   BMI 21.93 kg/m   Physical Exam  Constitutional: He is oriented to person, place, and time. He appears well-developed and well-nourished.  HENT:  Mouth/Throat: Oropharynx is clear and moist.  Eyes: Right eye exhibits no discharge. Left eye exhibits no discharge. No scleral icterus.  Cardiovascular: Normal rate, regular rhythm and intact distal pulses. Exam reveals no gallop and no friction rub.  No murmur heard. Pulmonary/Chest: No respiratory distress. He has no wheezes. He has no rales. He exhibits no tenderness.  Abdominal: Soft. Bowel sounds are normal. He exhibits no distension and no mass. There is no tenderness. There is no guarding.  Neurological: He is alert and oriented to person, place, and time.  Skin: Skin is warm and dry.  Psychiatric: He has a normal mood and affect.   Dg Chest 2 View  Result Date: 09/11/2017 CLINICAL DATA:  Pleuritic pain EXAM: CHEST  2 VIEW COMPARISON:  PA and lateral chest x-ray of May 29, 2016 FINDINGS: The lungs are mildly hyperinflated but  clear. There is no pneumothorax, pleural effusion, or alveolar infiltrate. The heart and pulmonary vascularity are normal. The mediastinum is normal in width. The bony thorax exhibits no acute abnormality. IMPRESSION: Mild stable hyperinflation may be voluntary but likely reflects reactive airway disease. No evidence of pneumonia nor other acute cardiopulmonary abnormality. Electronically Signed   By: David  Swazil[place]Jordan M.D.   On: 09/11/2017 09:22   Assessment and Plan :   RUQ pain - Plan: Comprehensive metabolic panel  Pleuritic pain - Plan: DG Chest 2 View  Need for Tdap vaccination  Labs pending, will refer to GI if LFTs remain elevated. Updated tdap today. Recheck in 3 months.  [name], PA-C [facility] [phone] 09/11/2017 9:10 AM

## 2017-09-12 LAB — COMPREHENSIVE METABOLIC PANEL
ALT: 25 IU/L (ref 0–44)
AST: 32 IU/L (ref 0–40)
Albumin/Globulin Ratio: 2 (ref 1.2–2.2)
Albumin: 4.7 g/dL (ref 3.5–5.5)
Alkaline Phosphatase: 117 IU/L (ref 39–117)
BUN/Creatinine Ratio: 10 (ref 9–20)
BUN: 11 mg/dL (ref 6–20)
Bilirubin Total: 0.4 mg/dL (ref 0.0–1.2)
CO2: 20 mmol/L (ref 20–29)
Calcium: 9.9 mg/dL (ref 8.7–10.2)
Chloride: 103 mmol/L (ref 96–106)
Creatinine, Ser: 1.12 mg/dL (ref 0.76–1.27)
GFR calc Af Amer: 106 mL/min/{1.73_m2} (ref >59)
GFR calc non Af Amer: 91 mL/min/{1.73_m2} (ref >59)
Globulin, Total: 2.4 g/dL (ref 1.5–4.5)
Glucose: 93 mg/dL (ref 65–99)
Potassium: 4.4 mmol/L (ref 3.5–5.2)
Sodium: 142 mmol/L (ref 134–144)
Total Protein: 7.1 g/dL (ref 6.0–8.5)

## 2017-10-04 ENCOUNTER — Ambulatory Visit: Payer: BLUE CROSS/BLUE SHIELD | Admitting: Urgent Care

## 2017-10-05 ENCOUNTER — Other Ambulatory Visit: Payer: Self-pay

## 2017-10-05 ENCOUNTER — Encounter: Payer: Self-pay | Admitting: Urgent Care

## 2017-10-05 ENCOUNTER — Ambulatory Visit: Payer: BLUE CROSS/BLUE SHIELD | Admitting: Urgent Care

## 2017-10-05 VITALS — BP 110/60 | HR 76 | Temp 98.3°F | Ht 67.0 in | Wt 138.6 lb

## 2017-10-05 DIAGNOSIS — M25521 Pain in right elbow: Secondary | ICD-10-CM | POA: Diagnosis not present

## 2017-10-05 DIAGNOSIS — R51 Headache: Secondary | ICD-10-CM | POA: Diagnosis not present

## 2017-10-05 DIAGNOSIS — M778 Other enthesopathies, not elsewhere classified: Secondary | ICD-10-CM | POA: Diagnosis not present

## 2017-10-05 DIAGNOSIS — R1011 Right upper quadrant pain: Secondary | ICD-10-CM | POA: Diagnosis not present

## 2017-10-05 DIAGNOSIS — R519 Headache, unspecified: Secondary | ICD-10-CM

## 2017-10-05 DIAGNOSIS — R748 Abnormal levels of other serum enzymes: Secondary | ICD-10-CM | POA: Diagnosis not present

## 2017-10-05 DIAGNOSIS — S5001XA Contusion of right elbow, initial encounter: Secondary | ICD-10-CM

## 2017-10-05 NOTE — Progress Notes (Signed)
    MRN: [id number] DOB: 1990/09/25  Subjective:   Dustin Weber is a 27 y.o. male presenting for follow up on intermittent RUQ. Last episode was ~1 week ago and has resolved. Patient did not make his GI appointment and is planning on rescheduling. Denies fever, n/v, diarrhea but does admit intermittent constipation. He cannot recall if it is associated with his RUQ pain. Also reports intermittent right elbow pain with contact against any surface or touch. Recalls that his co-worker accidentally made contact with his elbow while they were working and has hurt since then. Denies redness, warmth, swelling, bony deformity. He has not used any medications for pain and is limited due to his history of rhabdomyolysis, elevated CK. Has been having headaches, reports that he is not sleeping well, not hydrating well. He is getting less than 6 hours of sleep and works 2 jobs. Denies sinus pain, congestion, chest pain, cough, sore throat.    Dustin Weber has a current medication list which includes the following prescription(s): albuterol, cyclobenzaprine, and tramadol-acetaminophen. Also has No Known Allergies.  Dustin Weber  has a past medical history of Asthma. Also  has no past surgical history on file.  Objective:   Vitals: BP 110/60 (BP Location: Left Arm, Patient Position: Sitting, Cuff Size: Normal)   Pulse 76   Temp 98.3 F (36.8 C) (Oral)   Ht 5\' 7"  (1.702 m)   Wt 138 lb 9.6 oz (62.9 kg)   SpO2 99%   BMI 21.71 kg/m   Physical Exam  Constitutional: He is oriented to person, place, and time. He appears well-developed and well-nourished.  HENT:  Mouth/Throat: Oropharynx is clear and moist.  Cardiovascular: Normal rate.  Pulmonary/Chest: Effort normal.  Abdominal: Soft. Bowel sounds are normal. He exhibits no distension and no mass. There is no tenderness. There is no guarding.  Musculoskeletal:       Right elbow: He exhibits normal range of motion, no swelling, no effusion, no deformity and no  laceration. Tenderness (mild over area depicted but not the olecranon process) found.       Arms: Neurological: He is alert and oriented to person, place, and time.  Skin: Skin is warm and dry.  Psychiatric: He has a normal mood and affect.   Assessment and Plan :   RUQ pain - Plan: CK  Elevated creatine phosphokinase level - Plan: CK  Generalized headaches  Elbow pain, right - Plan: Sedimentation Rate  Tendinitis of elbow  Contusion of right elbow, initial encounter  Labs pending, recommended patient reschedule GI appt. For now hydrate better, eat high fiber diet to r/o constipation as a source of his symptoms. Recommended patient restart Flexeril, work less and sleep 7-8 hours per night. At the end of his visit, patient asked about hip and low back pain so we will pursue x-rays if his symptoms are not improved with aforementioned conservative measures. Return-to-clinic precautions discussed, patient verbalized understanding.   Wallis BambergMario Malvern Kadlec, PA-C [facility] [phone] 10/05/2017 8:43 AM

## 2017-10-05 NOTE — Patient Instructions (Addendum)
Contusion A contusion is a deep bruise. Contusions are the result of a blunt injury to tissues and muscle fibers under the skin. The injury causes bleeding under the skin. The skin overlying the contusion may turn blue, purple, or yellow. Minor injuries will give you a painless contusion, but more severe contusions may stay painful and swollen for a few weeks. What are the causes? This condition is usually caused by a blow, trauma, or direct force to an area of the body. What are the signs or symptoms? Symptoms of this condition include:  Swelling of the injured area.  Pain and tenderness in the injured area.  Discoloration. The area may have redness and then turn blue, purple, or yellow.  How is this diagnosed? This condition is diagnosed based on a physical exam and medical history. An X-ray, CT scan, or MRI may be needed to determine if there are any associated injuries, such as broken bones (fractures). How is this treated? Specific treatment for this condition depends on what area of the body was injured. In general, the best treatment for a contusion is resting, icing, applying pressure to (compression), and elevating the injured area. This is often called the RICE strategy. Over-the-counter anti-inflammatory medicines may also be recommended for pain control. Follow these instructions at home:  Rest the injured area.  If directed, apply ice to the injured area: ? Put ice in a plastic bag. ? Place a towel between your skin and the bag. ? Leave the ice on for 20 minutes, 2-3 times per day.  If directed, apply light compression to the injured area using an elastic bandage. Make sure the bandage is not wrapped too tightly. Remove and reapply the bandage as directed by your health care provider.  If possible, raise (elevate) the injured area above the level of your heart while you are sitting or lying down.  Take over-the-counter and prescription medicines only as told by your health  care provider. Contact a health care provider if:  Your symptoms do not improve after several days of treatment.  Your symptoms get worse.  You have difficulty moving the injured area. Get help right away if:  You have severe pain.  You have numbness in a hand or foot.  Your hand or foot turns pale or cold. This information is not intended to replace advice given to you by your health care provider. Make sure you discuss any questions you have with your health care provider. Document Released: 04/05/2005 Document Revised: 11/04/2015 Document Reviewed: 11/11/2014 Elsevier Interactive Patient Education  2018 [employer].    Tendinitis Tendinitis is inflammation of a tendon. A tendon is a strong cord of tissue that connects muscle to bone. Tendinitis can affect any tendon, but it most commonly affects the shoulder tendon (rotator cuff), ankle tendon (Achilles tendon), elbow tendon (triceps tendon), or one of the tendons in the wrist. What are the causes? This condition may be caused by:  Overusing a tendon or muscle. This is common.  Age-related wear and tear.  Injury.  Inflammatory conditions, such as arthritis.  Certain medicines.  What increases the risk? This condition is more likely to develop in people who do activities that involve repetitive motions. What are the signs or symptoms? Symptoms of this condition may include:  Pain.  Tenderness.  Mild swelling.  How is this diagnosed? This condition is diagnosed with a physical exam. You may also have tests, such as:  Ultrasound. This uses sound waves to make an image of  your affected area.  MRI.  How is this treated? This condition may be treated by resting, icing, applying pressure (compression), and raising (elevating) the area above the level of your heart. This is known as RICE therapy. Treatment may also include:  Medicines to help reduce inflammation or to help reduce pain.  Exercises or physical  therapy to strengthen and stretch the tendon.  A brace or splint.  Surgery (rare).  Follow these instructions at home:  If you have a splint or brace:  Wear the splint or brace as told by your health care provider. Remove it only as told by your health care provider.  Loosen the splint or brace if your fingers or toes tingle, become numb, or turn cold and blue.  Do not take baths, swim, or use a hot tub until your health care provider approves. Ask your health care provider if you can take showers. You may only be allowed to take sponge baths for bathing.  Do not let your splint or brace get wet if it is not waterproof. ? If your splint or brace is not waterproof, cover it with a watertight plastic bag when you take a bath or a shower.  Keep the splint or brace clean. Managing pain, stiffness, and swelling  If directed, apply ice to the affected area. ? Put ice in a plastic bag. ? Place a towel between your skin and the bag. ? Leave the ice on for 20 minutes, 2-3 times a day.  If directed, apply heat to the affected area as often as told by your health care provider. Use the heat source that your health care provider recommends, such as a moist heat pack or a heating pad. ? Place a towel between your skin and the heat source. ? Leave the heat on for 20-30 minutes. ? Remove the heat if your skin turns bright red. This is especially important if you are unable to feel pain, heat, or cold. You may have a greater risk of getting burned.  Move the fingers or toes of the affected limb often, if this applies. This can help to prevent stiffness and lessen swelling.  If directed, elevate the affected area above the level of your heart while you are sitting or lying down. Driving  Do not drive or operate heavy machinery while taking prescription pain medicine.  Ask your health care provider when it is safe to drive if you have a splint or brace on any part of your arm or  leg. Activity  Return to your normal activities as told by your health care provider. Ask your health care provider what activities are safe for you.  Rest the affected area as told by your health care provider.  Avoid using the affected area while you are experiencing symptoms of tendinitis.  Do exercises as told by your health care provider. General instructions  If you have a splint, do not put pressure on any part of the splint until it is fully hardened. This may take several hours.  Wear an elastic bandage or compression wrap only as told by your health care provider.  Take over-the-counter and prescription medicines only as told by your health care provider.  Keep all follow-up visits as told by your health care provider. This is important. Contact a health care provider if:  Your symptoms do not improve.  You develop new, unexplained problems, such as numbness in your hands. This information is not intended to replace advice given to you by  your health care provider. Make sure you discuss any questions you have with your health care provider. Document Released: 06/23/2000 Document Revised: 02/24/2016 Document Reviewed: 03/29/2015 Elsevier Interactive Patient Education  2018 [employer].     IF you received an x-ray today, you will receive an invoice from [facility]. Please contact [facility] at [phone] with questions or concerns regarding your invoice.   IF you received labwork today, you will receive an invoice from [place]. Please contact [facility] at [phone] with questions or concerns regarding your invoice.   Our billing staff will not be able to assist you with questions regarding bills from these companies.  You will be contacted with the lab results as soon as they are available. The fastest way to get your results is to activate your My Chart account. Instructions are located on the last page of this paperwork. If you have not heard  from [place] regarding the results in 2 weeks, please contact this office.

## 2017-10-06 LAB — SEDIMENTATION RATE: Sed Rate: 2 mm/hr (ref 0–15)

## 2017-10-06 LAB — CK: Total CK: 567 U/L (ref 24–204)

## 2017-10-08 ENCOUNTER — Telehealth: Payer: Self-pay | Admitting: Urgent Care

## 2017-10-08 NOTE — Telephone Encounter (Signed)
-----   Message from Wallis BambergMario Mani, [place]-C sent at 10/08/2017  2:28 PM EDT ----- Please schedule patient for a recheck asap on his CK level as it was elevated again. Thank you!

## 2017-10-08 NOTE — Telephone Encounter (Signed)
Copied from CRM [phone]. Topic: Quick Communication - See Telephone Encounter >> Oct 08, 2017 12:06 PM Raquel SarnaHayes, Teresa G wrote: [facility] [place] - [phone] opt 1  Ref # [id number]  Abnormal Result

## 2017-10-08 NOTE — Telephone Encounter (Signed)
[facility] and [facility] to schedule a recheck. Per DPR, left VM for him to call office and make an appt.

## 2017-10-19 ENCOUNTER — Other Ambulatory Visit: Payer: Self-pay

## 2017-10-19 ENCOUNTER — Encounter: Payer: Self-pay | Admitting: Urgent Care

## 2017-10-19 ENCOUNTER — Ambulatory Visit (INDEPENDENT_AMBULATORY_CARE_PROVIDER_SITE_OTHER): Payer: BLUE CROSS/BLUE SHIELD | Admitting: Urgent Care

## 2017-10-19 VITALS — BP 137/80 | HR 77 | Temp 98.2°F | Resp 16 | Ht 67.0 in | Wt 138.0 lb

## 2017-10-19 DIAGNOSIS — M791 Myalgia, unspecified site: Secondary | ICD-10-CM | POA: Diagnosis not present

## 2017-10-19 DIAGNOSIS — R748 Abnormal levels of other serum enzymes: Secondary | ICD-10-CM

## 2017-10-19 NOTE — Progress Notes (Signed)
    MRN: [id number] DOB: Nov 16, 1990  Subjective:   Dustin Weber is a 27 y.o. male presenting for follow up on elevated CK level.  His last office visit was on October 05, 2017, had elevated CK level at that time.  I requested that he come back ASAP for recheck on his CK level and overall health.  Today, he reports that he is doing very well, denies myalgia, cramps, hematuria, belly pain, fever, trauma.  He has previously had myalgia, elevated CK levels requiring fluids.  He has been seen by rheumatology and urology and has had negative workup.  Reports that he is hydrating very well and not doing strenuous physical activity.  He is avoiding NSAIDs.  Aaron has a current medication list which includes the following prescription(s): albuterol, cyclobenzaprine, and tramadol-acetaminophen. Also has No Known Allergies.  Denvil  has a past medical history of Asthma. Also  has no past surgical history on file.  Objective:   Vitals: BP 137/80   Pulse 77   Temp 98.2 F (36.8 C) (Oral)   Resp 16   Ht 5\' 7"  (1.702 m)   Wt 138 lb (62.6 kg)   SpO2 100%   BMI 21.61 kg/m   Physical Exam  Constitutional: He is oriented to person, place, and time. He appears well-developed and well-nourished.  Cardiovascular: Normal rate.  Pulmonary/Chest: Effort normal.  Neurological: He is alert and oriented to person, place, and time.  Psychiatric: He has a normal mood and affect.   Assessment and Plan :   Elevated creatine phosphokinase level - Plan: CK, Basic metabolic panel  Myalgia - Plan: Basic metabolic panel  Patient is stable, will recheck his labs in instructed patient that I may have him come in for fluids if his CK level remains elevated.  Patient verbalized understanding and is in agreement with the treatment plan  Wallis BambergMario Ranay Ketter, PA-C [facility] [phone] 10/19/2017 10:59 AM

## 2017-10-19 NOTE — Patient Instructions (Signed)
Creatine Kinase Test Why am I having this test? The creatine kinase (CK) test is performed to determine if there has been damage to muscle tissue in your body. This test can be used to help diagnose heart attack, neurologic diseases, or skeletal diseases. Three different forms of CK are present in your body. They are referred to as isoenzymes:  CK-MM is found in your skeletal muscles and heart.  CK-MB is found mostly in your heart.  CK-BB is found mostly in your brain.  What kind of sample is taken? A blood sample is required for this test. It is usually collected by inserting a needle into a vein. How do I prepare for this test? There is no preparation required for this test. Be aware that your health care provider may require blood samples to be taken at regular intervals for up to 1 week. What are the reference ranges? Reference ranges are considered healthy ranges established after testing a large group of healthy people. Reference ranges may vary among different people, labs, and hospitals. It is your responsibility to obtain your test results. Ask the lab or department performing the test when and how you will get your results. The reference ranges for the CK test are as follows: Total CK:  Adult or elderly (values are higher after exercise): ? Male: 55-170 units/L or 55-170 units/L (SI units). ? Male: 30-135 units/L or 30-135 units/L (SI units).  Newborn: 68-580 units/L (SI units). Isoenzymes:  CK-MM: 100%.  CK-MB: 0%.  CK-BB: 0%. What do the results mean? Levels of total CK that are above the reference ranges may indicate injury or diseases affecting the heart, skeletal muscle, or brain. Increased levels of CK-MM isoenzyme may indicate:  Certain diseases affecting the skeletal muscle.  Recent surgery, trauma, or injury.  Conditions that cause convulsions.  Increased levels of CK-MB isoenzyme may indicate:  Recent heart attack.  Other conditions that cause  injury to the heart muscle.  Increased levels of CK-BB isoenzyme may indicate:  Diseases that affect the central nervous system.  Certain psychiatric therapies.  Certain types of cancer.  Injury to the lungs.  Talk with your health care provider to discuss your results, treatment options, and if necessary, the need for more tests. Talk with your health care provider if you have any questions about your results. Talk with your health care provider to discuss your results, treatment options, and if necessary, the need for more tests. Talk with your health care provider if you have any questions about your results. This information is not intended to replace advice given to you by your health care provider. Make sure you discuss any questions you have with your health care provider. Document Released: 07/27/2004 Document Revised: 02/29/2016 Document Reviewed: 11/20/2013 Elsevier Interactive Patient Education  2018 [employer]vier Inc.  

## 2017-10-20 LAB — BASIC METABOLIC PANEL
BUN/Creatinine Ratio: 11 (ref 9–20)
BUN: 12 mg/dL (ref 6–20)
CO2: 23 mmol/L (ref 20–29)
Calcium: 9.5 mg/dL (ref 8.7–10.2)
Chloride: 103 mmol/L (ref 96–106)
Creatinine, Ser: 1.08 mg/dL (ref 0.76–1.27)
GFR calc Af Amer: 110 mL/min/{1.73_m2} (ref >59)
GFR calc non Af Amer: 96 mL/min/{1.73_m2} (ref >59)
Glucose: 85 mg/dL (ref 65–99)
Potassium: 4.7 mmol/L (ref 3.5–5.2)
Sodium: 141 mmol/L (ref 134–144)

## 2017-10-20 LAB — CK: Total CK: 754 U/L (ref 24–204)

## 2017-10-22 ENCOUNTER — Telehealth: Payer: Self-pay | Admitting: Family Medicine

## 2017-10-22 NOTE — Telephone Encounter (Signed)
[facility]. Previous CK 2 weeks ago 567. Left VM on provider phone advising abnormal test result for a patient.

## 2017-10-22 NOTE — Telephone Encounter (Signed)
[facility] phoned in results CK Total  754

## 2017-10-22 NOTE — Telephone Encounter (Signed)
Please call patient and schedule for IV fluids due to his elevated CK. I left VM for patient letting him know we would be doing this.

## 2017-10-24 NOTE — Telephone Encounter (Signed)
Called pt again today and left him a voicemail letting him know that Urban GibsonMani wants him to come in on the 18th or 19th to recheck his levels and get IV fluids.  Please schedule pt with mani in any slot (including same day slots)

## 2017-12-06 ENCOUNTER — Encounter: Payer: Self-pay | Admitting: Urgent Care

## 2017-12-06 ENCOUNTER — Ambulatory Visit (INDEPENDENT_AMBULATORY_CARE_PROVIDER_SITE_OTHER): Payer: BLUE CROSS/BLUE SHIELD | Admitting: Urgent Care

## 2017-12-06 VITALS — BP 122/76 | HR 95 | Temp 98.5°F | Ht 67.0 in | Wt 137.0 lb

## 2017-12-06 DIAGNOSIS — M791 Myalgia, unspecified site: Secondary | ICD-10-CM

## 2017-12-06 DIAGNOSIS — M25511 Pain in right shoulder: Secondary | ICD-10-CM | POA: Diagnosis not present

## 2017-12-06 DIAGNOSIS — M62838 Other muscle spasm: Secondary | ICD-10-CM | POA: Diagnosis not present

## 2017-12-06 DIAGNOSIS — R748 Abnormal levels of other serum enzymes: Secondary | ICD-10-CM

## 2017-12-06 MED ORDER — TRAMADOL-ACETAMINOPHEN 37.5-325 MG PO TABS: 1.0000 | ORAL_TABLET | Freq: Three times a day (TID) | ORAL | 0 refills | Status: DC | PRN

## 2017-12-06 MED ORDER — CYCLOBENZAPRINE HCL 5 MG PO TABS: 5.0000 mg | ORAL_TABLET | Freq: Three times a day (TID) | ORAL | 5 refills | Status: DC | PRN

## 2017-12-06 NOTE — Patient Instructions (Addendum)
Hydrate well with at least 2 liters (1 gallon) of water daily. You may take  Tylenol every 6 hours for pain and inflammation. Use Ultracet for breakthrough pain once every 6-8 hours.    IF you received an x-ray today, you will receive an invoice from [facility]. Please contact [facility] at [phone] with questions or concerns regarding your invoice.   IF you received labwork today, you will receive an invoice from [place]. Please contact [facility] at [phone] with questions or concerns regarding your invoice.   Our billing staff will not be able to assist you with questions regarding bills from these companies.  You will be contacted with the lab results as soon as they are available. The fastest way to get your results is to activate your My Chart account. Instructions are located on the last page of this paperwork. If you have not heard from [place] regarding the results in 2 weeks, please contact this office.

## 2017-12-06 NOTE — Progress Notes (Signed)
  [id number]  MRN: [id number] DOB: 15-Jul-1990  Subjective:   Dustin Weber is a 27 y.o. male presenting for 1 day history of right shoulder pain.  Patient was playing with his daughter and has also been doing a lot of heavy lifting at work.  Reports that pain is over his trapezius and his shoulder joint.  Denies fever, redness, warmth, swelling, trauma.  Patient has a complicated history of elevated CK levels, creatinine and LFTs.  He has had negative work-up with rheumatology and neurology.  Today, he reports that he has been using some Tylenol and would like a refill Flexeril as this is helped him in the past.  Dustin Weber has a current medication list which includes the following prescription(s): albuterol, cyclobenzaprine, and tramadol-acetaminophen. Also has No Known Allergies.  Dustin Weber  has a past medical history of Asthma. Also  has no past surgical history on file.  Objective:   Vitals: BP 122/76 (BP Location: Right Arm, Patient Position: Sitting, Cuff Size: Normal)   Pulse 95   Temp 98.5 F (36.9 C) (Oral)   Ht  (1.702 m)   Wt 137 lb (62.1 kg)   SpO2 100%   BMI 21.46 kg/m   Physical Exam  Constitutional: He is oriented to person, place, and time. He appears well-developed and well-nourished.  Cardiovascular: Normal rate.  Pulmonary/Chest: Effort normal.  Musculoskeletal:       Right shoulder: He exhibits tenderness (over areas depicted) and spasm (trapezius). He exhibits normal range of motion, no swelling, no effusion, no crepitus, no deformity, no laceration and normal strength.       Arms: Neurological: He is alert and oriented to person, place, and time.   Assessment and Plan :   Acute pain of right shoulder  Myalgia - Plan: CK, Comprehensive metabolic panel, Sedimentation Rate, CANCELED: Basic metabolic panel  Elevated creatine phosphokinase level - Plan: Comprehensive metabolic panel, Sedimentation Rate  Muscle spasm - Plan: cyclobenzaprine (FLEXERIL) 5 MG tablet,  Sedimentation Rate  Patient is high risk for using NSAIDs due to his previous history.  Recommend he schedule Tylenol, refill Flexeril.  We will have patient use Ultracet for breakthrough pain.  Labs pending.  Wrote for work restrictions.  Follow-up in 1 week.  Wallis Bamberg, PA-C [facility] [phone] 12/06/2017  12:20 PM

## 2017-12-07 LAB — COMPREHENSIVE METABOLIC PANEL
ALT: 29 IU/L (ref 0–44)
AST: 24 IU/L (ref 0–40)
Albumin/Globulin Ratio: 2 (ref 1.2–2.2)
Albumin: 4.6 g/dL (ref 3.5–5.5)
Alkaline Phosphatase: 100 IU/L (ref 39–117)
BUN/Creatinine Ratio: 18 (ref 9–20)
BUN: 19 mg/dL (ref 6–20)
Bilirubin Total: 0.3 mg/dL (ref 0.0–1.2)
CO2: 24 mmol/L (ref 20–29)
Calcium: 9.7 mg/dL (ref 8.7–10.2)
Chloride: 102 mmol/L (ref 96–106)
Creatinine, Ser: 1.03 mg/dL (ref 0.76–1.27)
GFR calc Af Amer: 117 mL/min/{1.73_m2} (ref >59)
GFR calc non Af Amer: 101 mL/min/{1.73_m2} (ref >59)
Globulin, Total: 2.3 g/dL (ref 1.5–4.5)
Glucose: 87 mg/dL (ref 65–99)
Potassium: 4.7 mmol/L (ref 3.5–5.2)
Sodium: 141 mmol/L (ref 134–144)
Total Protein: 6.9 g/dL (ref 6.0–8.5)

## 2017-12-07 LAB — SEDIMENTATION RATE: Sed Rate: 2 mm/hr (ref 0–15)

## 2017-12-07 LAB — CK: Total CK: 234 U/L — ABNORMAL HIGH (ref 24–204)

## 2017-12-13 ENCOUNTER — Ambulatory Visit: Payer: BLUE CROSS/BLUE SHIELD | Admitting: Urgent Care

## 2017-12-13 ENCOUNTER — Encounter: Payer: Self-pay | Admitting: Urgent Care

## 2017-12-13 VITALS — BP 150/80 | HR 97 | Temp 98.2°F | Resp 16 | Ht 67.0 in | Wt 141.0 lb

## 2017-12-13 DIAGNOSIS — M791 Myalgia, unspecified site: Secondary | ICD-10-CM

## 2017-12-13 DIAGNOSIS — M25512 Pain in left shoulder: Secondary | ICD-10-CM | POA: Diagnosis not present

## 2017-12-13 DIAGNOSIS — Z87448 Personal history of other diseases of urinary system: Secondary | ICD-10-CM | POA: Diagnosis not present

## 2017-12-13 DIAGNOSIS — M25511 Pain in right shoulder: Secondary | ICD-10-CM | POA: Diagnosis not present

## 2017-12-13 MED ORDER — CELECOXIB 100 MG PO CAPS: 100.0000 mg | ORAL_CAPSULE | Freq: Two times a day (BID) | ORAL | 1 refills | Status: DC

## 2017-12-13 NOTE — Progress Notes (Signed)
    MRN: [id number] DOB: May 04, 1991  Subjective:   Dustin Weber is a 27 y.o. male presenting for follow up on right shoulder pain.  At his last office visit on 12/06/2017, patient reported right shoulder pain.  He has had history of elevated CK levels and renal insufficiency.  He has had work-up done with rheumatology and neurology.  Work-up was negative.  We have hesitated to use NSAIDs in the past because of his renal insufficiency.  Today, patient reports that his right shoulder is better but now his left shoulder is bothering him as well.  Shoulder pain is occurring daily for the past week and is intermittent, sharp and can limit his range of motion.  He denies falls, trauma, redness, swelling.  He has tried to limit his activity and has been using Ultracet and Flexeril.  He is also hydrating very well.  Dustin Weber has a current medication list which includes the following prescription(s): albuterol, cyclobenzaprine, and tramadol-acetaminophen. Also has No Known Allergies.  Dustin Weber  has a past medical history of Asthma. Also  has no past surgical history on file.  Objective:   Vitals: BP (!) 150/80   Pulse 97   Temp 98.2 F (36.8 C) (Oral)   Resp 16   Ht 5\' 7"  (1.702 m)   Wt 141 lb (64 kg)   SpO2 100%   BMI 22.08 kg/m   Physical Exam  Constitutional: He is oriented to person, place, and time. He appears well-developed and well-nourished.  Cardiovascular: Normal rate.  Pulmonary/Chest: Effort normal.  Musculoskeletal:       Left shoulder: He exhibits tenderness (over areas depicted) and spasm (trapezius). He exhibits normal range of motion, no bony tenderness, no swelling, no effusion, no crepitus, no deformity and normal strength.       Arms: Neurological: He is alert and oriented to person, place, and time.   Assessment and Plan :   Acute pain of left shoulder - Plan: Basic metabolic panel, CK  Acute pain of right shoulder - Plan: Basic metabolic panel, CK  Myalgia  History  of renal insufficiency  Right shoulder pain is improved, left shoulder pain will be managed with Celebrex.  His last consecutive CK levels and bmet have been pretty unremarkable.  However patient is high risk given his history of severely elevated CK level with renal insufficiency.  Counseled patient on close monitoring of his symptoms with the use of Celebrex.  He is to maintain Ultracet and Flexeril, hydrate aggressively.  We will maintain work restrictions for the next week.  Patient is to follow-up in 1 week.  I will call patient let him know if he needs to stop his Celebrex based off of his labs from today.  Wallis BambergMario Yassir Enis, PA-C [facility] [phone] 12/13/2017 9:42 AM

## 2017-12-13 NOTE — Patient Instructions (Addendum)
Muscle Pain, Adult Muscle pain (myalgia) may be mild or severe. In most cases, the pain lasts only a short time and it goes away without treatment. It is normal to feel some muscle pain after starting a workout program. Muscles that have not been used often will be sore at first. Muscle pain may also be caused by many other things, including:  Overuse or muscle strain, especially if you are not in shape. This is the most common cause of muscle pain.  Injury.  Bruises.  Viruses, such as the flu.  Infectious diseases.  A chronic condition that causes muscle tenderness, fatigue, and headache (fibromyalgia).  A condition, such as lupus, in which the body's disease-fighting system attacks other organs in the body (autoimmune or rheumatologic diseases).  Certain drugs, including ACE inhibitors and statins.  To diagnose the cause of your muscle pain, your health care provider will do a physical exam and ask questions about the pain and when it began. If you have not had muscle pain for very long, your health care provider may want to wait before doing much testing. If your muscle pain has lasted a long time, your health care provider may want to run tests right away. In some cases, this may include tests to rule out certain conditions or illnesses. Treatment for muscle pain depends on the cause. Home care is often enough to relieve muscle pain. Your health care provider may also prescribe anti-inflammatory medicine. Follow these instructions at home: Activity  If overuse is causing your muscle pain: ? Slow down your activities until the pain goes away. ? Do regular, gentle exercises if you are not usually active. ? Warm up before exercising. Stretch before and after exercising. This can help lower the risk of muscle pain.  Do not continue working out if the pain is very bad. Bad pain could mean that you have injured a muscle. Managing pain and discomfort   If directed, apply ice to the  sore muscle: ? Put ice in a plastic bag. ? Place a towel between your skin and the bag. ? Leave the ice on for 20 minutes, 2-3 times a day.  You may also alternate between applying ice and applying heat as told by your health care provider. To apply heat, use the heat source that your health care provider recommends, such as a moist heat pack or a heating pad. ? Place a towel between your skin and the heat source. ? Leave the heat on for 20-30 minutes. ? Remove the heat if your skin turns bright red. This is especially important if you are unable to feel pain, heat, or cold. You may have a greater risk of getting burned. Medicines  Take over-the-counter and prescription medicines only as told by your health care provider.  Do not drive or use heavy machinery while taking prescription pain medicine. Contact a health care provider if:  Your muscle pain gets worse and medicines do not help.  You have muscle pain that lasts longer than 3 days.  You have a rash or fever along with muscle pain.  You have muscle pain after a tick bite.  You have muscle pain while working out, even though you are in good physical condition.  You have redness, soreness, or swelling along with muscle pain.  You have muscle pain after starting a new medicine or changing the dose of a medicine. Get help right away if:  You have trouble breathing.  You have trouble swallowing.  You have   muscle pain along with a stiff neck, fever, and vomiting.  You have severe muscle weakness or cannot move part of your body. This information is not intended to replace advice given to you by your health care provider. Make sure you discuss any questions you have with your health care provider. Document Released: 05/18/2006 Document Revised: 01/14/2016 Document Reviewed: 11/16/2015 Elsevier Interactive Patient Education  2018 Elsevier Inc.     IF you received an x-ray today, you will receive an invoice from [facility]. Please contact [facility] at [phone] with questions or concerns regarding your invoice.   IF you received labwork today, you will receive an invoice from [facility]. Please contact [facility] at [phone] with questions or concerns regarding your invoice.   Our billing staff will not be able to assist you with questions regarding bills from these companies.  You will be contacted with the lab results as soon as they are available. The fastest way to get your results is to activate your My Chart account. Instructions are located on the last page of this paperwork. If you have not heard from us regarding the results in 2 weeks, please contact this office.      

## 2017-12-14 LAB — BASIC METABOLIC PANEL
BUN/Creatinine Ratio: 14 (ref 9–20)
BUN: 16 mg/dL (ref 6–20)
CO2: 25 mmol/L (ref 20–29)
Calcium: 10.1 mg/dL (ref 8.7–10.2)
Chloride: 99 mmol/L (ref 96–106)
Creatinine, Ser: 1.13 mg/dL (ref 0.76–1.27)
GFR calc Af Amer: 105 mL/min/{1.73_m2} (ref >59)
GFR calc non Af Amer: 90 mL/min/{1.73_m2} (ref >59)
Glucose: 86 mg/dL (ref 65–99)
Potassium: 4.7 mmol/L (ref 3.5–5.2)
Sodium: 139 mmol/L (ref 134–144)

## 2017-12-14 LAB — CK: Total CK: 311 U/L — ABNORMAL HIGH (ref 24–204)

## 2017-12-20 ENCOUNTER — Ambulatory Visit: Payer: BLUE CROSS/BLUE SHIELD | Admitting: Urgent Care

## 2017-12-20 ENCOUNTER — Encounter: Payer: Self-pay | Admitting: Urgent Care

## 2017-12-20 ENCOUNTER — Other Ambulatory Visit: Payer: Self-pay

## 2017-12-20 VITALS — BP 132/68 | HR 80 | Temp 98.6°F | Resp 16 | Ht 66.0 in | Wt 138.0 lb

## 2017-12-20 DIAGNOSIS — M25512 Pain in left shoulder: Secondary | ICD-10-CM | POA: Diagnosis not present

## 2017-12-20 DIAGNOSIS — R748 Abnormal levels of other serum enzymes: Secondary | ICD-10-CM

## 2017-12-20 DIAGNOSIS — M791 Myalgia, unspecified site: Secondary | ICD-10-CM | POA: Diagnosis not present

## 2017-12-20 DIAGNOSIS — Z87448 Personal history of other diseases of urinary system: Secondary | ICD-10-CM | POA: Diagnosis not present

## 2017-12-20 NOTE — Progress Notes (Signed)
MRN: [id number] DOB: September 13, 1990  Subjective:   Dustin Weber is a 27 y.o. male presenting for follow up on left shoulder pain and elevated CK.  Patient has a history of undifferentiated elevated CK levels and a history of acute renal insufficiency.  His work is very strenuous and has to do perform heavy lifting randomly.  I saw patient on 12/06/2017 and initially had right shoulder pain then.  We wrote for work restrictions given his history and elevated CK levels.  He was held from work and followed up in a week on 12/13/2017.  At that point patient reports that his right shoulder pain had improved but was having left shoulder pain.  His creatinine level has been today, patient reports normal including electrolytes and blood sugar but his CK level is up trending from 234 2 weeks ago to 311 at his last office visit.  Denies fever, nausea, vomiting, swelling, hematuria, belly pain.  He reports that he is using Celebrex and it is helping.  However, his myalgias are persisting.  He has been using Flexeril as well and hydrating aggressively.  Unfortunately, his work still does not want to accommodate light duty both because it poses a risk for harm in the workplace but also because they generally have only heavy items for the patient's lift.  [place] has a current medication list which includes the following prescription(s): albuterol, celecoxib, cyclobenzaprine, and tramadol-acetaminophen. Also has No Known Allergies.  Stanly  has a past medical history of Asthma. Also  has no past surgical history on file.  Objective:   Vitals: BP 132/68   Pulse 80   Temp 98.6 F (37 C) (Oral)   Resp 16   Ht 5\' 6"  (1.676 m)   Wt 138 lb (62.6 kg)   SpO2 99%   BMI 22.27 kg/m   Physical Exam  Constitutional: He is oriented to person, place, and time. He appears well-developed and well-nourished.  HENT:  Mouth/Throat: Oropharynx is clear and moist.  Eyes: Right eye exhibits no discharge. Left eye exhibits no  discharge. No scleral icterus.  Cardiovascular: Normal rate.  Pulmonary/Chest: Effort normal.  Musculoskeletal:       Left shoulder: He exhibits pain (over area depicted by report only). He exhibits normal range of motion, no tenderness, no bony tenderness, no swelling, no effusion, no crepitus, no deformity, no laceration, no spasm, normal pulse and normal strength.       Cervical back: He exhibits tenderness and spasm. He exhibits normal range of motion, no bony tenderness, no swelling, no edema and no deformity.       Back:       Arms: Neurological: He is alert and oriented to person, place, and time. He displays normal reflexes. He exhibits normal muscle tone. Coordination normal.  Skin: Skin is warm and dry.  Psychiatric: He has a normal mood and affect.    Assessment and Plan :   Acute pain of left shoulder - Plan: CK, Ambulatory referral to Rheumatology  Myalgia - Plan: CK, Creatinine with Est GFR, Ambulatory referral to Rheumatology  History of renal insufficiency - Plan: CK, Creatinine with Est GFR, Ambulatory referral to Rheumatology  Elevated creatine phosphokinase level - Plan: CK, Ambulatory referral to Rheumatology  We will maintain his Celebrex since it is helping and he has not had any signs and symptoms of renal insufficiency.  Continue to hydrate aggressively, use Flexeril.  Patient will try to get a massage from a massage therapist today.  I  will refer back to rheumatology given his persistently elevated CK levels and the uptrend in his CK level in the setting of persistent myalgia.  We will follow-up in 1 week and will try to complete his FMLA paperwork.  Counseled on signs and symptoms of renal insufficiency, patient was instructed to stop Celebrex immediately if this happens.  Otherwise maintain follow-up in a week.  Wallis BambergMario Yuma Blucher, PA-C [facility] [phone] 12/20/2017 10:15 AM

## 2017-12-20 NOTE — Patient Instructions (Addendum)
Creatine Kinase Test Why am I having this test? The creatine kinase (CK) test is performed to determine if there has been damage to muscle tissue in your body. This test can be used to help diagnose heart attack, neurologic diseases, or skeletal diseases. Three different forms of CK are present in your body. They are referred to as isoenzymes:  CK-MM is found in your skeletal muscles and heart.  CK-MB is found mostly in your heart.  CK-BB is found mostly in your brain.  What kind of sample is taken? A blood sample is required for this test. It is usually collected by inserting a needle into a vein. How do I prepare for this test? There is no preparation required for this test. Be aware that your health care provider may require blood samples to be taken at regular intervals for up to 1 week. What are the reference ranges? Reference ranges are considered healthy ranges established after testing a large group of healthy people. Reference ranges may vary among different people, labs, and hospitals. It is your responsibility to obtain your test results. Ask the lab or department performing the test when and how you will get your results. The reference ranges for the CK test are as follows: Total CK:  Adult or elderly (values are higher after exercise): ? Male: 55-170 units/L or 55-170 units/L (SI units). ? Male: 30-135 units/L or 30-135 units/L (SI units).  Newborn: 68-580 units/L (SI units). Isoenzymes:  CK-MM: 100%.  CK-MB: 0%.  CK-BB: 0%. What do the results mean? Levels of total CK that are above the reference ranges may indicate injury or diseases affecting the heart, skeletal muscle, or brain. Increased levels of CK-MM isoenzyme may indicate:  Certain diseases affecting the skeletal muscle.  Recent surgery, trauma, or injury.  Conditions that cause convulsions.  Increased levels of CK-MB isoenzyme may indicate:  Recent heart attack.  Other conditions that cause  injury to the heart muscle.  Increased levels of CK-BB isoenzyme may indicate:  Diseases that affect the central nervous system.  Certain psychiatric therapies.  Certain types of cancer.  Injury to the lungs.  Talk with your health care provider to discuss your results, treatment options, and if necessary, the need for more tests. Talk with your health care provider if you have any questions about your results. Talk with your health care provider to discuss your results, treatment options, and if necessary, the need for more tests. Talk with your health care provider if you have any questions about your results. This information is not intended to replace advice given to you by your health care provider. Make sure you discuss any questions you have with your health care provider. Document Released: 07/27/2004 Document Revised: 02/29/2016 Document Reviewed: 11/20/2013 Elsevier Interactive Patient Education  2018 Elsevier Inc.     Muscle Pain, Adult Muscle pain (myalgia) may be mild or severe. In most cases, the pain lasts only a short time and it goes away without treatment. It is normal to feel some muscle pain after starting a workout program. Muscles that have not been used often will be sore at first. Muscle pain may also be caused by many other things, including:  Overuse or muscle strain, especially if you are not in shape. This is the most common cause of muscle pain.  Injury.  Bruises.  Viruses, such as the flu.  Infectious diseases.  A chronic condition that causes muscle tenderness, fatigue, and headache (fibromyalgia).  A condition, such as lupus, in which  the body's disease-fighting system attacks other organs in the body (autoimmune or rheumatologic diseases).  Certain drugs, including ACE inhibitors and statins.  To diagnose the cause of your muscle pain, your health care provider will do a physical exam and ask questions about the pain and when it began. If you  have not had muscle pain for very long, your health care provider may want to wait before doing much testing. If your muscle pain has lasted a long time, your health care provider may want to run tests right away. In some cases, this may include tests to rule out certain conditions or illnesses. Treatment for muscle pain depends on the cause. Home care is often enough to relieve muscle pain. Your health care provider may also prescribe anti-inflammatory medicine. Follow these instructions at home: Activity  If overuse is causing your muscle pain: ? Slow down your activities until the pain goes away. ? Do regular, gentle exercises if you are not usually active. ? Warm up before exercising. Stretch before and after exercising. This can help lower the risk of muscle pain.  Do not continue working out if the pain is very bad. Bad pain could mean that you have injured a muscle. Managing pain and discomfort   If directed, apply ice to the sore muscle: ? Put ice in a plastic bag. ? Place a towel between your skin and the bag. ? Leave the ice on for 20 minutes, 2-3 times a day.  You may also alternate between applying ice and applying heat as told by your health care provider. To apply heat, use the heat source that your health care provider recommends, such as a moist heat pack or a heating pad. ? Place a towel between your skin and the heat source. ? Leave the heat on for 20-30 minutes. ? Remove the heat if your skin turns bright red. This is especially important if you are unable to feel pain, heat, or cold. You may have a greater risk of getting burned. Medicines  Take over-the-counter and prescription medicines only as told by your health care provider.  Do not drive or use heavy machinery while taking prescription pain medicine. Contact a health care provider if:  Your muscle pain gets worse and medicines do not help.  You have muscle pain that lasts longer than 3 days.  You have a  rash or fever along with muscle pain.  You have muscle pain after a tick bite.  You have muscle pain while working out, even though you are in good physical condition.  You have redness, soreness, or swelling along with muscle pain.  You have muscle pain after starting a new medicine or changing the dose of a medicine. Get help right away if:  You have trouble breathing.  You have trouble swallowing.  You have muscle pain along with a stiff neck, fever, and vomiting.  You have severe muscle weakness or cannot move part of your body. This information is not intended to replace advice given to you by your health care provider. Make sure you discuss any questions you have with your health care provider. Document Released: 05/18/2006 Document Revised: 01/14/2016 Document Reviewed: 11/16/2015 Elsevier Interactive Patient Education  2018 [employer]     IF you received an x-ray today, you will receive an invoice from [facility]. Please contact [facility] at [phone] with questions or concerns regarding your invoice.   IF you received labwork today, you will receive an invoice from [facility]. Please contact  [facility] at [phone] with questions or concerns regarding your invoice.   Our billing staff will not be able to assist you with questions regarding bills from these companies.  You will be contacted with the lab results as soon as they are available. The fastest way to get your results is to activate your My Chart account. Instructions are located on the last page of this paperwork. If you have not heard from [place] regarding the results in 2 weeks, please contact this office.

## 2017-12-21 LAB — CREATININE WITH EST GFR
Creatinine, Ser: 1.16 mg/dL (ref 0.76–1.27)
GFR calc Af Amer: 101 mL/min/{1.73_m2} (ref >59)
GFR calc non Af Amer: 88 mL/min/{1.73_m2} (ref >59)

## 2017-12-21 LAB — CK: Total CK: 236 U/L — ABNORMAL HIGH (ref 24–204)

## 2017-12-28 ENCOUNTER — Encounter: Payer: Self-pay | Admitting: Urgent Care

## 2017-12-28 ENCOUNTER — Ambulatory Visit: Payer: BLUE CROSS/BLUE SHIELD | Admitting: Urgent Care

## 2017-12-28 VITALS — BP 138/78 | HR 110 | Temp 98.1°F | Resp 16

## 2017-12-28 DIAGNOSIS — R748 Abnormal levels of other serum enzymes: Secondary | ICD-10-CM | POA: Diagnosis not present

## 2017-12-28 DIAGNOSIS — M62838 Other muscle spasm: Secondary | ICD-10-CM

## 2017-12-28 DIAGNOSIS — M791 Myalgia, unspecified site: Secondary | ICD-10-CM

## 2017-12-28 NOTE — Patient Instructions (Addendum)
You may take 500mg  Tylenol every 6 hours for pain and inflammation. You can also use Celebrex with this and Flexeril. If your pain resolves, you may return to work but you can also check in with me prior to going back.     Muscle Pain, Adult Muscle pain (myalgia) may be mild or severe. In most cases, the pain lasts only a short time and it goes away without treatment. It is normal to feel some muscle pain after starting a workout program. Muscles that have not been used often will be sore at first. Muscle pain may also be caused by many other things, including:  Overuse or muscle strain, especially if you are not in shape. This is the most common cause of muscle pain.  Injury.  Bruises.  Viruses, such as the flu.  Infectious diseases.  A chronic condition that causes muscle tenderness, fatigue, and headache (fibromyalgia).  A condition, such as lupus, in which the body's disease-fighting system attacks other organs in the body (autoimmune or rheumatologic diseases).  Certain drugs, including ACE inhibitors and statins.  To diagnose the cause of your muscle pain, your health care provider will do a physical exam and ask questions about the pain and when it began. If you have not had muscle pain for very long, your health care provider may want to wait before doing much testing. If your muscle pain has lasted a long time, your health care provider may want to run tests right away. In some cases, this may include tests to rule out certain conditions or illnesses. Treatment for muscle pain depends on the cause. Home care is often enough to relieve muscle pain. Your health care provider may also prescribe anti-inflammatory medicine. Follow these instructions at home: Activity  If overuse is causing your muscle pain: ? Slow down your activities until the pain goes away. ? Do regular, gentle exercises if you are not usually active. ? Warm up before exercising. Stretch before and after  exercising. This can help lower the risk of muscle pain.  Do not continue working out if the pain is very bad. Bad pain could mean that you have injured a muscle. Managing pain and discomfort   If directed, apply ice to the sore muscle: ? Put ice in a plastic bag. ? Place a towel between your skin and the bag. ? Leave the ice on for 20 minutes, 2-3 times a day.  You may also alternate between applying ice and applying heat as told by your health care provider. To apply heat, use the heat source that your health care provider recommends, such as a moist heat pack or a heating pad. ? Place a towel between your skin and the heat source. ? Leave the heat on for 20-30 minutes. ? Remove the heat if your skin turns bright red. This is especially important if you are unable to feel pain, heat, or cold. You may have a greater risk of getting burned. Medicines  Take over-the-counter and prescription medicines only as told by your health care provider.  Do not drive or use heavy machinery while taking prescription pain medicine. Contact a health care provider if:  Your muscle pain gets worse and medicines do not help.  You have muscle pain that lasts longer than 3 days.  You have a rash or fever along with muscle pain.  You have muscle pain after a tick bite.  You have muscle pain while working out, even though you are in good physical condition.  You have redness, soreness, or swelling along with muscle pain.  You have muscle pain after starting a new medicine or changing the dose of a medicine. Get help right away if:  You have trouble breathing.  You have trouble swallowing.  You have muscle pain along with a stiff neck, fever, and vomiting.  You have severe muscle weakness or cannot move part of your body. This information is not intended to replace advice given to you by your health care provider. Make sure you discuss any questions you have with your health care  provider. Document Released: 05/18/2006 Document Revised: 01/14/2016 Document Reviewed: 11/16/2015 Elsevier Interactive Patient Education  2018 [employer].     IF you received an x-ray today, you will receive an invoice from [facility]. Please contact [facility] at [phone] with questions or concerns regarding your invoice.   IF you received labwork today, you will receive an invoice from [facility]. Please contact [facility] at [phone] with questions or concerns regarding your invoice.   Our billing staff will not be able to assist you with questions regarding bills from these companies.  You will be contacted with the lab results as soon as they are available. The fastest way to get your results is to activate your My Chart account. Instructions are located on the last page of this paperwork. If you have not heard from [place] regarding the results in 2 weeks, please contact this office.

## 2017-12-28 NOTE — Progress Notes (Signed)
   MRN: [id number] DOB: 1990-11-13  Subjective:   Dustin Weber is a 27 y.o. male presenting for follow-up on myalgia, joint pains.  Today he reports that his shoulder joints are not bothering him as much.  He states that he is still having back spasms.  He did go see his rheumatologist Dr. Deanne CofferAryal and reports that his CK level was fine.  There is no follow-up scheduled at this point.  He presents with FMLA paperwork.  His CK levels are still elevated from our records.  His creatinine and liver enzymes have been within normal limits.  Dustin Weber has a current medication list which includes the following prescription(s): albuterol, celecoxib, cyclobenzaprine, and tramadol-acetaminophen. Also has No Known Allergies.  Dustin Weber  has a past medical history of Asthma. Also  has no past surgical history on file.  Objective:   Vitals: BP 138/78   Pulse (!) 110   Temp 98.1 F (36.7 C) (Oral)   Resp 16   SpO2 99%   Physical Exam  Constitutional: He is oriented to person, place, and time. He appears well-developed and well-nourished.  Cardiovascular: Normal rate.  Pulmonary/Chest: Effort normal.  Neurological: He is alert and oriented to person, place, and time.  Psychiatric: He has a normal mood and affect.   Assessment and Plan :   Myalgia - Plan: CK, Creatinine with Est GFR, Uric A+ANA+RA Qn+CRP+ASO, CYCLIC CITRUL PEPTIDE ANTIBODY, IGG/IGA, CANCELED: Creatinine with Est GFR  Elevated creatine phosphokinase level - Plan: CK, Creatinine with Est GFR, CANCELED: Creatinine with Est GFR  Muscle spasm - Plan: CK, Creatinine with Est GFR, Uric A+ANA+RA Qn+CRP+ASO, CYCLIC CITRUL PEPTIDE ANTIBODY, IGG/IGA, CANCELED: Creatinine with Est GFR  Labs pending, will continue to monitor his CK level which is downtrending.  I will consult with his rheumatologist on plan for follow-up.  FMLA paperwork completed.  For now given that his creatinine level and LFTs have been normal we will continue to use Celebrex and  will add back Tylenol.  Patient can maintain Flexeril and recommended he hydrate aggressively.  Follow-up in 1 week.  Wallis BambergMario Ardian Haberland, PA-C [facility] [phone] 12/28/2017  10:24 AM

## 2018-01-01 ENCOUNTER — Encounter: Payer: Self-pay | Admitting: Urgent Care

## 2018-01-01 LAB — CYCLIC CITRUL PEPTIDE ANTIBODY, IGG/IGA: Cyclic Citrullin Peptide Ab: 5 units (ref 0–19)

## 2018-01-01 LAB — CK: Total CK: 150 U/L (ref 24–204)

## 2018-01-01 LAB — URIC A+ANA+RA QN+CRP+ASO
ASO: <20 IU/mL (ref 0.0–200.0)
Anti Nuclear Antibody(ANA): NEGATIVE
CRP: 1 mg/L (ref 0–10)
Rhuematoid fact SerPl-aCnc: <10 IU/mL (ref 0.0–13.9)
Uric Acid: 6 mg/dL (ref 3.7–8.6)

## 2018-01-01 LAB — CREATININE WITH EST GFR
Creatinine, Ser: 1.15 mg/dL (ref 0.76–1.27)
GFR calc Af Amer: 102 mL/min/{1.73_m2} (ref >59)
GFR calc non Af Amer: 89 mL/min/{1.73_m2} (ref >59)

## 2018-01-03 ENCOUNTER — Telehealth: Payer: Self-pay

## 2018-01-03 NOTE — Telephone Encounter (Signed)
Copied from CRM [id number]. Topic: General - Other >> Jan 03, 2018  3:00 PM Gaynelle AduPoole, Shalonda wrote: Reason for CRM: Patient called to see if he needs to keep his appt for tomorrow with Dr.Mani

## 2018-01-03 NOTE — Telephone Encounter (Signed)
Patient does not have to follow-up unless he has returned to work and is having a recurrence of muscle aching, joint pains.  In this case he would need to keep his office visit for tomorrow.

## 2018-01-04 ENCOUNTER — Ambulatory Visit: Payer: BLUE CROSS/BLUE SHIELD | Admitting: Urgent Care

## 2018-01-04 ENCOUNTER — Encounter: Payer: Self-pay | Admitting: Urgent Care

## 2018-01-04 VITALS — BP 126/73 | HR 70 | Temp 97.2°F | Resp 16 | Ht 66.0 in | Wt 138.0 lb

## 2018-01-04 DIAGNOSIS — M791 Myalgia, unspecified site: Secondary | ICD-10-CM

## 2018-01-04 DIAGNOSIS — M2550 Pain in unspecified joint: Secondary | ICD-10-CM | POA: Diagnosis not present

## 2018-01-04 LAB — POCT URINALYSIS DIP (MANUAL ENTRY)
Bilirubin, UA: NEGATIVE
Blood, UA: NEGATIVE
Glucose, UA: NEGATIVE mg/dL
Leukocytes, UA: NEGATIVE
Nitrite, UA: NEGATIVE
Protein Ur, POC: NEGATIVE mg/dL
Spec Grav, UA: 1.02 (ref 1.010–1.025)
Urobilinogen, UA: 1 E.U./dL
pH, UA: 7 (ref 5.0–8.0)

## 2018-01-04 MED ORDER — CELECOXIB 200 MG PO CAPS: 200.0000 mg | ORAL_CAPSULE | Freq: Every day | ORAL | 1 refills | Status: DC

## 2018-01-04 NOTE — Patient Instructions (Addendum)
Muscle Pain, Adult Muscle pain (myalgia) may be mild or severe. In most cases, the pain lasts only a short time and it goes away without treatment. It is normal to feel some muscle pain after starting a workout program. Muscles that have not been used often will be sore at first. Muscle pain may also be caused by many other things, including:  Overuse or muscle strain, especially if you are not in shape. This is the most common cause of muscle pain.  Injury.  Bruises.  Viruses, such as the flu.  Infectious diseases.  A chronic condition that causes muscle tenderness, fatigue, and headache (fibromyalgia).  A condition, such as lupus, in which the body's disease-fighting system attacks other organs in the body (autoimmune or rheumatologic diseases).  Certain drugs, including ACE inhibitors and statins.  To diagnose the cause of your muscle pain, your health care provider will do a physical exam and ask questions about the pain and when it began. If you have not had muscle pain for very long, your health care provider may want to wait before doing much testing. If your muscle pain has lasted a long time, your health care provider may want to run tests right away. In some cases, this may include tests to rule out certain conditions or illnesses. Treatment for muscle pain depends on the cause. Home care is often enough to relieve muscle pain. Your health care provider may also prescribe anti-inflammatory medicine. Follow these instructions at home: Activity  If overuse is causing your muscle pain: ? Slow down your activities until the pain goes away. ? Do regular, gentle exercises if you are not usually active. ? Warm up before exercising. Stretch before and after exercising. This can help lower the risk of muscle pain.  Do not continue working out if the pain is very bad. Bad pain could mean that you have injured a muscle. Managing pain and discomfort   If directed, apply ice to the  sore muscle: ? Put ice in a plastic bag. ? Place a towel between your skin and the bag. ? Leave the ice on for 20 minutes, 2-3 times a day.  You may also alternate between applying ice and applying heat as told by your health care provider. To apply heat, use the heat source that your health care provider recommends, such as a moist heat pack or a heating pad. ? Place a towel between your skin and the heat source. ? Leave the heat on for 20-30 minutes. ? Remove the heat if your skin turns bright red. This is especially important if you are unable to feel pain, heat, or cold. You may have a greater risk of getting burned. Medicines  Take over-the-counter and prescription medicines only as told by your health care provider.  Do not drive or use heavy machinery while taking prescription pain medicine. Contact a health care provider if:  Your muscle pain gets worse and medicines do not help.  You have muscle pain that lasts longer than 3 days.  You have a rash or fever along with muscle pain.  You have muscle pain after a tick bite.  You have muscle pain while working out, even though you are in good physical condition.  You have redness, soreness, or swelling along with muscle pain.  You have muscle pain after starting a new medicine or changing the dose of a medicine. Get help right away if:  You have trouble breathing.  You have trouble swallowing.  You have   muscle pain along with a stiff neck, fever, and vomiting.  You have severe muscle weakness or cannot move part of your body. This information is not intended to replace advice given to you by your health care provider. Make sure you discuss any questions you have with your health care provider. Document Released: 05/18/2006 Document Revised: 01/14/2016 Document Reviewed: 11/16/2015 Elsevier Interactive Patient Education  2018 Elsevier Inc.     IF you received an x-ray today, you will receive an invoice from [facility]. Please contact [facility] at [phone] with questions or concerns regarding your invoice.   IF you received labwork today, you will receive an invoice from [facility]. Please contact [facility] at [phone] with questions or concerns regarding your invoice.   Our billing staff will not be able to assist you with questions regarding bills from these companies.  You will be contacted with the lab results as soon as they are available. The fastest way to get your results is to activate your My Chart account. Instructions are located on the last page of this paperwork. If you have not heard from us regarding the results in 2 weeks, please contact this office.      

## 2018-01-04 NOTE — Progress Notes (Signed)
   MRN: [id number] DOB: 07/25/1990  Subjective:   Dustin Weber is a 27 y.o. male presenting for follow up on bilateral and persistent shoulder pain.  Pain is worse after working.  He continues to work with [employer], does lifting, works in a very hot environment and sweats profusely.  States that he tries to drink 2 L of water per day using his 64 ounce water bottle.  Patient has been using Celebrex, Flexeril daily. Also uses Ultracet as needed.  Dr. Casimer LaniusGovinda Aryal has work with patient on rheumatologic level.  At his last office visit with Dr. Deanne CofferAryal, labs were normal.  Jourdon has a current medication list which includes the following prescription(s): albuterol, celecoxib, cyclobenzaprine, and tramadol-acetaminophen. Also has No Known Allergies.  Yaw  has a past medical history of Asthma. Denies past surgical history.  Objective:   Vitals: BP 126/73   Pulse 70   Temp (!) 97.2 F (36.2 C) (Oral)   Resp 16   Ht 5\' 6"  (1.676 m)   Wt 138 lb (62.6 kg)   SpO2 99%   BMI 22.27 kg/m   Physical Exam  Constitutional: He is oriented to person, place, and time. He appears well-developed and well-nourished.  Cardiovascular: Normal rate.  Pulmonary/Chest: Effort normal.  Musculoskeletal:       Right shoulder: He exhibits tenderness and spasm. He exhibits normal range of motion, no bony tenderness, no swelling, no effusion, no crepitus, no deformity, no laceration, no pain and normal strength.       Left shoulder: He exhibits tenderness and spasm. He exhibits normal range of motion, no bony tenderness, no swelling, no effusion, no crepitus, no deformity, no laceration, no pain and normal strength.       Arms: Neurological: He is alert and oriented to person, place, and time.   Results for orders placed or performed in visit on 01/04/18 (from the past 24 hour(s))  POCT urinalysis dipstick     Status: Abnormal   Collection Time: 01/04/18  2:22 PM  Result Value Ref Range   Color, UA yellow yellow   Clarity, UA clear clear   Glucose, UA negative negative mg/dL   Bilirubin, UA negative negative   Ketones, POC UA trace (5) (A) negative mg/dL   Spec Grav, UA 4.0981.020 1.1911.010 - 1.025   Blood, UA negative negative   pH, UA 7.0 5.0 - 8.0   Protein Ur, POC negative negative mg/dL   Urobilinogen, UA 1.0 0.2 or 1.0 E.U./dL   Nitrite, UA Negative Negative   Leukocytes, UA Negative Negative   Assessment and Plan :   Myalgia - Plan: POCT urinalysis dipstick  Multiple joint pain  Will increase his Celebrex to 200 mg once daily.  Patient is to use this on his work days.  Counseled that he needs to drink between 80-100 ounces of water on his work days.  He will try to mix in electrolytes or drink Gatorade.  Recheck in 2 weeks.  Consider referral back to Dr. Deanne CofferAryal or physical therapy or orthopedics.  Wallis BambergMario Ilias Stcharles, PA-C [facility] [phone] 01/04/2018 1:57 PM

## 2018-01-04 NOTE — Telephone Encounter (Signed)
Patient states he is still having symptoms and will keep appointment

## 2018-01-18 ENCOUNTER — Ambulatory Visit: Payer: BLUE CROSS/BLUE SHIELD | Admitting: Urgent Care

## 2018-01-18 ENCOUNTER — Encounter: Payer: Self-pay | Admitting: Urgent Care

## 2018-01-18 ENCOUNTER — Ambulatory Visit (INDEPENDENT_AMBULATORY_CARE_PROVIDER_SITE_OTHER): Payer: BLUE CROSS/BLUE SHIELD

## 2018-01-18 VITALS — BP 126/74 | HR 75 | Temp 97.5°F | Resp 17 | Ht 66.0 in | Wt 138.0 lb

## 2018-01-18 DIAGNOSIS — M791 Myalgia, unspecified site: Secondary | ICD-10-CM

## 2018-01-18 DIAGNOSIS — M2550 Pain in unspecified joint: Secondary | ICD-10-CM

## 2018-01-18 DIAGNOSIS — M62838 Other muscle spasm: Secondary | ICD-10-CM

## 2018-01-18 DIAGNOSIS — M25512 Pain in left shoulder: Secondary | ICD-10-CM | POA: Diagnosis not present

## 2018-01-18 DIAGNOSIS — R748 Abnormal levels of other serum enzymes: Secondary | ICD-10-CM

## 2018-01-18 DIAGNOSIS — G8929 Other chronic pain: Secondary | ICD-10-CM

## 2018-01-18 DIAGNOSIS — Z87448 Personal history of other diseases of urinary system: Secondary | ICD-10-CM

## 2018-01-18 NOTE — Progress Notes (Signed)
    MRN: [id number] DOB: [birth year]  Subjective:   Dustin Weber is a 27 y.o. male presenting for follow up on chronic myalgia.  Patient reports that he is improved and denies myalgia since his last office visit on 01/04/2018.  He states that he has been working and focusing very much on hydrating adequately each day.  He still has Celebrex and Flexeril and Ultracet to use as needed.  Reports that he is still having left shoulder joint pain.  Denies swelling, trauma, redness, weakness.  Dustin Weber has a current medication list which includes the following prescription(s): albuterol, celecoxib, cyclobenzaprine, and tramadol-acetaminophen. Also has No Known Allergies.  Dustin Weber  has a past medical history of Asthma. Denies past surgical history.  Objective:   Vitals: BP 126/74   Pulse 75   Temp (!) 97.5 F (36.4 C) (Oral)   Resp 17   Ht 5\' 6"  (1.676 m)   Wt 138 lb (62.6 kg)   SpO2 98%   BMI 22.27 kg/m   Physical Exam  Constitutional: He is oriented to person, place, and time. He appears well-developed and well-nourished.  Cardiovascular: Normal rate.  Pulmonary/Chest: Effort normal.  Musculoskeletal:       Left shoulder: He exhibits tenderness. He exhibits normal range of motion, no bony tenderness, no swelling, no effusion, no crepitus, no deformity, no spasm and normal strength.  Mild tenderness over AC joint and deltoids.  Neurological: He is alert and oriented to person, place, and time.   Assessment and Plan :   Chronic left shoulder pain - Plan: DG Shoulder Left  Myalgia  Multiple joint pain  Elevated creatine phosphokinase level  Muscle spasm  History of renal insufficiency  Patient is improved, reviewed rotator cuff strengthening and warm up exercises.  He is to maintain hydration and use medications as needed for his pain and inflammation.  He plans on returning to clinic tomorrow to discuss his FMLA given that he forgot it today.  We will also plan to do labs  tomorrow.  Dustin BambergMario Sheri Prows, PA-C [facility] [phone] 01/18/2018 10:33 AM

## 2018-01-18 NOTE — Patient Instructions (Addendum)
For your shoulder rehab exercises, perform each exercise every other day. Complete 3 sets with 15 reps going through each exercise as a set.     IF you received an x-ray today, you will receive an invoice from [facility]. Please contact [facility] at [phone] with questions or concerns regarding your invoice.   IF you received labwork today, you will receive an invoice from [facility]. Please contact [facility] at [phone] with questions or concerns regarding your invoice.   Our billing staff will not be able to assist you with questions regarding bills from these companies.  You will be contacted with the lab results as soon as they are available. The fastest way to get your results is to activate your My Chart account. Instructions are located on the last page of this paperwork. If you have not heard from us regarding the results in 2 weeks, please contact this office.

## 2018-01-19 ENCOUNTER — Ambulatory Visit: Payer: BLUE CROSS/BLUE SHIELD | Admitting: Urgent Care

## 2018-01-21 ENCOUNTER — Ambulatory Visit: Payer: BLUE CROSS/BLUE SHIELD | Admitting: Urgent Care

## 2018-01-21 ENCOUNTER — Encounter: Payer: Self-pay | Admitting: Urgent Care

## 2018-01-21 VITALS — BP 126/81 | HR 77 | Temp 97.9°F | Resp 16 | Ht 66.0 in | Wt 138.0 lb

## 2018-01-21 DIAGNOSIS — Z87448 Personal history of other diseases of urinary system: Secondary | ICD-10-CM

## 2018-01-21 DIAGNOSIS — G8929 Other chronic pain: Secondary | ICD-10-CM

## 2018-01-21 DIAGNOSIS — M791 Myalgia, unspecified site: Secondary | ICD-10-CM

## 2018-01-21 DIAGNOSIS — M25512 Pain in left shoulder: Secondary | ICD-10-CM

## 2018-01-21 DIAGNOSIS — M2550 Pain in unspecified joint: Secondary | ICD-10-CM

## 2018-01-21 DIAGNOSIS — R748 Abnormal levels of other serum enzymes: Secondary | ICD-10-CM | POA: Diagnosis not present

## 2018-01-21 NOTE — Progress Notes (Signed)
    MRN: [id number] DOB: 29-Jul-1990  Subjective:   Dustin Weber is a 27 y.o. male presenting for recheck on left shoulder.  At his last office visit on 01/18/2018, patient was doing well except for persistent, mild left shoulder joint pain.  States that it got much worse that same night after his office visit.  But in the subsequent days reports improvement.  He continues to hydrate well, use Celebrex and Flexeril.  Reports that he started doing his exercises as we reviewed them at his last office visit.  He also wanted to let me know that his short-term disability was denied and he plans on looking into appealing.  Dustin Weber has a current medication list which includes the following prescription(s): albuterol, celecoxib, cyclobenzaprine, and tramadol-acetaminophen. Also has No Known Allergies.  Dustin Weber  has a past medical history of Asthma. Also  has no past surgical history on file.  Objective:   Vitals: BP 126/81   Pulse 77   Temp 97.9 F (36.6 C) (Oral)   Resp 16   Ht 5\' 6"  (1.676 m)   Wt 138 lb (62.6 kg)   SpO2 100%   BMI 22.27 kg/m   Physical Exam  Constitutional: He is oriented to person, place, and time. He appears well-developed and well-nourished.  Cardiovascular: Normal rate.  Pulmonary/Chest: Effort normal.  Neurological: He is alert and oriented to person, place, and time.   Dg Shoulder Left  Result Date: 01/18/2018 CLINICAL DATA:  Left shoulder pain EXAM: LEFT SHOULDER - 2+ VIEW COMPARISON:  None. FINDINGS: No fracture or dislocation is seen. Joint spaces are preserved. Visualized soft tissues are within normal limits. Visualized left lung is clear. IMPRESSION: Negative. Electronically Signed   By: Charline BillsSriyesh  Krishnan M.D.   On: 01/18/2018 10:44    Assessment and Plan :   Chronic left shoulder pain  Elevated creatine phosphokinase level - Plan: Creatinine with Est GFR, CK  History of renal insufficiency - Plan: Creatinine with Est GFR, CK  Myalgia  Multiple joint  pain  Encouraged patient to continue doing shoulder exercises and using Celebrex and Flexeril as needed.  We will plan on rechecking his creatinine and CK levels today.  We will complete any paperwork patient brings by as we need to.  Otherwise follow-up in 2 weeks.  Wallis BambergMario Aniel Hubble, PA-C [facility] [phone] 01/21/2018  10:05 AM

## 2018-01-21 NOTE — Patient Instructions (Addendum)
Joint Pain Joint pain, which is also called arthralgia, can be caused by many things. Joint pain often goes away when you follow your health care provider's instructions for relieving pain at home. However, joint pain can also be caused by conditions that require further treatment. Common causes of joint pain include:  Bruising in the area of the joint.  Overuse of the joint.  Wear and tear on the joints that occur with aging (osteoarthritis).  Various other forms of arthritis.  A buildup of a crystal form of uric acid in the joint (gout).  Infections of the joint (septic arthritis) or of the bone (osteomyelitis).  Your health care provider may recommend medicine to help with the pain. If your joint pain continues, additional tests may be needed to diagnose your condition. Follow these instructions at home: Watch your condition for any changes. Follow these instructions as directed to lessen the pain that you are feeling.  Take medicines only as directed by your health care provider.  Rest the affected area for as long as your health care provider says that you should. If directed to do so, raise the painful joint above the level of your heart while you are sitting or lying down.  Do not do things that cause or worsen pain.  If directed, apply ice to the painful area: ? Put ice in a plastic bag. ? Place a towel between your skin and the bag. ? Leave the ice on for 20 minutes, 2-3 times per day.  Wear an elastic bandage, splint, or sling as directed by your health care provider. Loosen the elastic bandage or splint if your fingers or toes become numb and tingle, or if they turn cold and blue.  Begin exercising or stretching the affected area as directed by your health care provider. Ask your health care provider what types of exercise are safe for you.  Keep all follow-up visits as directed by your health care provider. This is important.  Contact a health care provider if:  Your  pain increases, and medicine does not help.  Your joint pain does not improve within 3 days.  You have increased bruising or swelling.  You have a fever.  You lose 10 lb (4.5 kg) or more without trying. Get help right away if:  You are not able to move the joint.  Your fingers or toes become numb or they turn cold and blue. This information is not intended to replace advice given to you by your health care provider. Make sure you discuss any questions you have with your health care provider. Document Released: 06/26/2005 Document Revised: 11/26/2015 Document Reviewed: 04/07/2014 Elsevier Interactive Patient Education  2018 Elsevier Inc.     IF you received an x-ray today, you will receive an invoice from [facility]. Please contact [facility] at [phone] with questions or concerns regarding your invoice.   IF you received labwork today, you will receive an invoice from [facility]. Please contact [facility] at [phone] with questions or concerns regarding your invoice.   Our billing staff will not be able to assist you with questions regarding bills from these companies.  You will be contacted with the lab results as soon as they are available. The fastest way to get your results is to activate your My Chart account. Instructions are located on the last page of this paperwork. If you have not heard from us regarding the results in 2 weeks, please contact this office.      

## 2018-01-22 ENCOUNTER — Telehealth: Payer: Self-pay | Admitting: Urgent Care

## 2018-01-22 LAB — CK: Total CK: 280 U/L — ABNORMAL HIGH (ref 24–204)

## 2018-01-22 LAB — CREATININE WITH EST GFR
Creatinine, Ser: 1.09 mg/dL (ref 0.76–1.27)
GFR calc Af Amer: 109 mL/min/{1.73_m2} (ref >59)
GFR calc non Af Amer: 94 mL/min/{1.73_m2} (ref >59)

## 2018-01-22 NOTE — Telephone Encounter (Signed)
Pt given results and recommendations per notes of Jordan LikesMani Mario,PA on 01/22/18. Pt verbalized understanding.Unable to document in result note due to result note not being routed to [facility].

## 2018-02-05 ENCOUNTER — Ambulatory Visit: Payer: BLUE CROSS/BLUE SHIELD | Admitting: Urgent Care

## 2018-02-05 ENCOUNTER — Encounter: Payer: Self-pay | Admitting: Urgent Care

## 2018-02-05 VITALS — BP 123/80 | HR 61 | Temp 97.4°F | Resp 16

## 2018-02-05 DIAGNOSIS — M791 Myalgia, unspecified site: Secondary | ICD-10-CM

## 2018-02-05 DIAGNOSIS — M2550 Pain in unspecified joint: Secondary | ICD-10-CM | POA: Diagnosis not present

## 2018-02-05 DIAGNOSIS — M25511 Pain in right shoulder: Secondary | ICD-10-CM

## 2018-02-05 DIAGNOSIS — Z87448 Personal history of other diseases of urinary system: Secondary | ICD-10-CM | POA: Diagnosis not present

## 2018-02-05 DIAGNOSIS — M25512 Pain in left shoulder: Secondary | ICD-10-CM

## 2018-02-05 DIAGNOSIS — G8929 Other chronic pain: Secondary | ICD-10-CM

## 2018-02-05 LAB — POCT URINALYSIS DIP (MANUAL ENTRY)
Bilirubin, UA: NEGATIVE
Blood, UA: NEGATIVE
Glucose, UA: NEGATIVE mg/dL
Ketones, POC UA: NEGATIVE mg/dL
Leukocytes, UA: NEGATIVE
Nitrite, UA: NEGATIVE
Protein Ur, POC: NEGATIVE mg/dL
Spec Grav, UA: 1.025 (ref 1.010–1.025)
Urobilinogen, UA: 0.2 E.U./dL
pH, UA: 6.5 (ref 5.0–8.0)

## 2018-02-05 NOTE — Progress Notes (Signed)
   MRN: [id number] DOB: 1991-02-20  Subjective:   Dustin Weber is a 27 y.o. male presenting for follow up on joint pains.  At his last office visit on 01/21/2018, patient was having good improvement in his bilateral shoulder pains.  Today, he reports that he has been doing more shoulder strengthening exercises and trying to warm up before going to work.  He states that he is hydrating as well as he can.  Has been using Celebrex and Flexeril with good results.  Denies myalgia, fever, hematuria, nausea, vomiting, belly pain.  He does report that his employer ended up denying the FMLA claim and he is unsure why.  States that he will try to obtain the paperwork and return it to me so that we can continue to help him, he does admit that being fired from his work has not been brought up despite the days that he missed.  Dustin Weber has a current medication list which includes the following prescription(s): albuterol, celecoxib, cyclobenzaprine, and tramadol-acetaminophen. Also has No Known Allergies.  Dustin Weber  has a past medical history of Asthma. Denies past surgical history.   Objective:   Vitals: BP 123/80   Pulse 61   Temp (!) 97.4 F (36.3 C) (Oral)   Resp 16   SpO2 100%   Physical Exam  Constitutional: He is oriented to person, place, and time. He appears well-developed and well-nourished.  Cardiovascular: Normal rate.  Pulmonary/Chest: Effort normal.  Neurological: He is alert and oriented to person, place, and time.    Results for orders placed or performed in visit on 02/05/18 (from the past 24 hour(s))  POCT urinalysis dipstick     Status: None   Collection Time: 02/05/18  9:26 AM  Result Value Ref Range   Color, UA yellow yellow   Clarity, UA clear clear   Glucose, UA negative negative mg/dL   Bilirubin, UA negative negative   Ketones, POC UA negative negative mg/dL   Spec Grav, UA 4.0981.025 1.1911.010 - 1.025   Blood, UA negative negative   pH, UA 6.5 5.0 - 8.0   Protein Ur, POC  negative negative mg/dL   Urobilinogen, UA 0.2 0.2 or 1.0 E.U./dL   Nitrite, UA Negative Negative   Leukocytes, UA Negative Negative    Assessment and Plan :   Chronic pain of both shoulders  Multiple joint pain - Plan: POCT urinalysis dipstick, Basic metabolic panel, CK  Myalgia - Plan: CK  History of renal insufficiency - Plan: POCT urinalysis dipstick, Basic metabolic panel, CK  Patient has improved, recommended continued efforts at hydration and shoulder strengthening/exercises.  I am happy to help patient with his documentation for FMLA or short-term disability as long as he presents the paperwork.  Patient states that he will try to obtain and forwarded to us.  Wallis BambergMario Tarence Searcy, PA-C [facility] [phone] 02/05/2018 9:33 AM

## 2018-02-05 NOTE — Patient Instructions (Addendum)
Joint Pain Joint pain, which is also called arthralgia, can be caused by many things. Joint pain often goes away when you follow your health care provider's instructions for relieving pain at home. However, joint pain can also be caused by conditions that require further treatment. Common causes of joint pain include:  Bruising in the area of the joint.  Overuse of the joint.  Wear and tear on the joints that occur with aging (osteoarthritis).  Various other forms of arthritis.  A buildup of a crystal form of uric acid in the joint (gout).  Infections of the joint (septic arthritis) or of the bone (osteomyelitis).  Your health care provider may recommend medicine to help with the pain. If your joint pain continues, additional tests may be needed to diagnose your condition. Follow these instructions at home: Watch your condition for any changes. Follow these instructions as directed to lessen the pain that you are feeling.  Take medicines only as directed by your health care provider.  Rest the affected area for as long as your health care provider says that you should. If directed to do so, raise the painful joint above the level of your heart while you are sitting or lying down.  Do not do things that cause or worsen pain.  If directed, apply ice to the painful area: ? Put ice in a plastic bag. ? Place a towel between your skin and the bag. ? Leave the ice on for 20 minutes, 2-3 times per day.  Wear an elastic bandage, splint, or sling as directed by your health care provider. Loosen the elastic bandage or splint if your fingers or toes become numb and tingle, or if they turn cold and blue.  Begin exercising or stretching the affected area as directed by your health care provider. Ask your health care provider what types of exercise are safe for you.  Keep all follow-up visits as directed by your health care provider. This is important.  Contact a health care provider if:  Your  pain increases, and medicine does not help.  Your joint pain does not improve within 3 days.  You have increased bruising or swelling.  You have a fever.  You lose 10 lb (4.5 kg) or more without trying. Get help right away if:  You are not able to move the joint.  Your fingers or toes become numb or they turn cold and blue. This information is not intended to replace advice given to you by your health care provider. Make sure you discuss any questions you have with your health care provider. Document Released: 06/26/2005 Document Revised: 11/26/2015 Document Reviewed: 04/07/2014 Elsevier Interactive Patient Education  2018 Elsevier Inc.     IF you received an x-ray today, you will receive an invoice from [facility]. Please contact [facility] at [phone] with questions or concerns regarding your invoice.   IF you received labwork today, you will receive an invoice from [facility]. Please contact [facility] at [phone] with questions or concerns regarding your invoice.   Our billing staff will not be able to assist you with questions regarding bills from these companies.  You will be contacted with the lab results as soon as they are available. The fastest way to get your results is to activate your My Chart account. Instructions are located on the last page of this paperwork. If you have not heard from us regarding the results in 2 weeks, please contact this office.      

## 2018-02-06 LAB — BASIC METABOLIC PANEL
BUN/Creatinine Ratio: 12 (ref 9–20)
BUN: 12 mg/dL (ref 6–20)
CO2: 24 mmol/L (ref 20–29)
Calcium: 9.8 mg/dL (ref 8.7–10.2)
Chloride: 100 mmol/L (ref 96–106)
Creatinine, Ser: 0.98 mg/dL (ref 0.76–1.27)
GFR calc Af Amer: 124 mL/min/{1.73_m2} (ref >59)
GFR calc non Af Amer: 107 mL/min/{1.73_m2} (ref >59)
Glucose: 90 mg/dL (ref 65–99)
Potassium: 4.4 mmol/L (ref 3.5–5.2)
Sodium: 140 mmol/L (ref 134–144)

## 2018-02-06 LAB — CK: Total CK: 429 U/L — ABNORMAL HIGH (ref 24–204)

## 2018-02-16 ENCOUNTER — Encounter: Payer: Self-pay | Admitting: Urgent Care

## 2018-04-02 ENCOUNTER — Ambulatory Visit: Payer: BLUE CROSS/BLUE SHIELD | Admitting: Family Medicine

## 2018-04-02 ENCOUNTER — Other Ambulatory Visit: Payer: Self-pay

## 2018-04-02 ENCOUNTER — Encounter: Payer: Self-pay | Admitting: Family Medicine

## 2018-04-02 VITALS — BP 133/87 | HR 103 | Temp 99.0°F | Ht 67.0 in | Wt 131.8 lb

## 2018-04-02 DIAGNOSIS — R6889 Other general symptoms and signs: Secondary | ICD-10-CM

## 2018-04-02 DIAGNOSIS — R05 Cough: Secondary | ICD-10-CM

## 2018-04-02 DIAGNOSIS — J069 Acute upper respiratory infection, unspecified: Secondary | ICD-10-CM

## 2018-04-02 DIAGNOSIS — F17210 Nicotine dependence, cigarettes, uncomplicated: Secondary | ICD-10-CM

## 2018-04-02 DIAGNOSIS — J4520 Mild intermittent asthma, uncomplicated: Secondary | ICD-10-CM

## 2018-04-02 DIAGNOSIS — R059 Cough, unspecified: Secondary | ICD-10-CM

## 2018-04-02 LAB — POCT INFLUENZA A/B
Influenza A, POC: NEGATIVE
Influenza B, POC: NEGATIVE

## 2018-04-02 MED ORDER — ALBUTEROL SULFATE HFA 108 (90 BASE) MCG/ACT IN AERS: 1.0000 | INHALATION_SPRAY | Freq: Four times a day (QID) | RESPIRATORY_TRACT | 0 refills | Status: DC | PRN

## 2018-04-02 MED ORDER — BENZONATATE 100 MG PO CAPS: 100.0000 mg | ORAL_CAPSULE | Freq: Three times a day (TID) | ORAL | 0 refills | Status: DC | PRN

## 2018-04-02 NOTE — Patient Instructions (Addendum)
Flu test was negative today.  I suspected to have a viral upper respiratory infection that should continue to improve.  Tessalon Perles if needed up to 3 times per day for cough, albuterol if needed for wheezing.  If you require albuterol more than a few times per day, or persistently require albuterol in the next 3 to 4 days, return for recheck as other medication may be needed.  Make sure to drink plenty of fluids, rest as needed as well.  Return to the clinic or go to the nearest emergency room if any of your symptoms worsen or new symptoms occur.  See information below regarding quitting smoking.  I am happy to help you with that if resources are needed.  Thank you for coming in today.  Upper Respiratory Infection, Adult Most upper respiratory infections (URIs) are caused by a virus. A URI affects the nose, throat, and upper air passages. The most common type of URI is often called "the common cold." Follow these instructions at home:  Take medicines only as told by your doctor.  Gargle warm saltwater or take cough drops to comfort your throat as told by your doctor.  Use a warm mist humidifier or inhale steam from a shower to increase air moisture. This may make it easier to breathe.  Drink enough fluid to keep your pee (urine) clear or pale yellow.  Eat soups and other clear broths.  Have a healthy diet.  Rest as needed.  Go back to work when your fever is gone or your doctor says it is okay. ? You may need to stay home longer to avoid giving your URI to others. ? You can also wear a face mask and wash your hands often to prevent spread of the virus.  Use your inhaler more if you have asthma.  Do not use any tobacco products, including cigarettes, chewing tobacco, or electronic cigarettes. If you need help quitting, ask your doctor. Contact a doctor if:  You are getting worse, not better.  Your symptoms are not helped by medicine.  You have chills.  You are getting more  short of breath.  You have brown or red mucus.  You have yellow or brown discharge from your nose.  You have pain in your face, especially when you bend forward.  You have a fever.  You have puffy (swollen) neck glands.  You have pain while swallowing.  You have white areas in the back of your throat. Get help right away if:  You have very bad or constant: ? Headache. ? Ear pain. ? Pain in your forehead, behind your eyes, and over your cheekbones (sinus pain). ? Chest pain.  You have long-lasting (chronic) lung disease and any of the following: ? Wheezing. ? Long-lasting cough. ? Coughing up blood. ? A change in your usual mucus.  You have a stiff neck.  You have changes in your: ? Vision. ? Hearing. ? Thinking. ? Mood. This information is not intended to replace advice given to you by your health care provider. Make sure you discuss any questions you have with your health care provider. Document Released: 12/13/2007 Document Revised: 02/27/2016 Document Reviewed: 10/01/2013 Elsevier Interactive Patient Education  2018 [employer].   Steps to Quit Smoking Smoking tobacco can be bad for your health. It can also affect almost every organ in your body. Smoking puts you and people around you at risk for many serious long-lasting (chronic) diseases. Quitting smoking is hard, but it is one of  the best things that you can do for your health. It is never too late to quit. What are the benefits of quitting smoking? When you quit smoking, you lower your risk for getting serious diseases and conditions. They can include:  Lung cancer or lung disease.  Heart disease.  Stroke.  Heart attack.  Not being able to have children (infertility).  Weak bones (osteoporosis) and broken bones (fractures).  If you have coughing, wheezing, and shortness of breath, those symptoms may get better when you quit. You may also get sick less often. If you are pregnant, quitting smoking can  help to lower your chances of having a baby of low birth weight. What can I do to help me quit smoking? Talk with your doctor about what can help you quit smoking. Some things you can do (strategies) include:  Quitting smoking totally, instead of slowly cutting back how much you smoke over a period of time.  Going to in-person counseling. You are more likely to quit if you go to many counseling sessions.  Using resources and support systems, such as: ? Agricultural engineernline chats with a Veterinary surgeoncounselor. ? Phone quitlines. ? Automotive engineerrinted self-help materials. ? Support groups or group counseling. ? Text messaging programs. ? Mobile phone apps or applications.  Taking medicines. Some of these medicines may have nicotine in them. If you are pregnant or breastfeeding, do not take any medicines to quit smoking unless your doctor says it is okay. Talk with your doctor about counseling or other things that can help you.  Talk with your doctor about using more than one strategy at the same time, such as taking medicines while you are also going to in-person counseling. This can help make quitting easier. What things can I do to make it easier to quit? Quitting smoking might feel very hard at first, but there is a lot that you can do to make it easier. Take these steps:  Talk to your family and friends. Ask them to support and encourage you.  Call phone quitlines, reach out to support groups, or work with a Veterinary surgeoncounselor.  Ask people who smoke to not smoke around you.  Avoid places that make you want (trigger) to smoke, such as: ? Bars. ? Parties. ? Smoke-break areas at work.  Spend time with people who do not smoke.  Lower the stress in your life. Stress can make you want to smoke. Try these things to help your stress: ? Getting regular exercise. ? Deep-breathing exercises. ? Yoga. ? Meditating. ? Doing a body scan. To do this, close your eyes, focus on one area of your body at a time from head to toe, and notice  which parts of your body are tense. Try to relax the muscles in those areas.  Download or buy apps on your mobile phone or tablet that can help you stick to your quit plan. There are many free apps, such as QuitGuide from the [employer] developer(Centers for Disease Control and Prevention). You can find more support from smokefree.gov and other websites.  This information is not intended to replace advice given to you by your health care provider. Make sure you discuss any questions you have with your health care provider. Document Released: 04/22/2009 Document Revised: 02/22/2016 Document Reviewed: 11/10/2014 Elsevier Interactive Patient Education  [employer]2018 Elsevier Inc.   If you have lab work done today you will be contacted with your lab results within the next 2 weeks.  If you have not heard from us then please  contact us. The fastest way to get your results is to register for My Chart.   IF you received an x-ray today, you will receive an invoice from [facility]. Please contact [facility] at [phone] with questions or concerns regarding your invoice.   IF you received labwork today, you will receive an invoice from [place]. Please contact [facility] at [phone] with questions or concerns regarding your invoice.   Our billing staff will not be able to assist you with questions regarding bills from these companies.  You will be contacted with the lab results as soon as they are available. The fastest way to get your results is to activate your My Chart account. Instructions are located on the last page of this paperwork. If you have not heard from [place] regarding the results in 2 weeks, please contact this office.

## 2018-04-02 NOTE — Progress Notes (Signed)
Subjective:  By signing my name below, I, Stann Ore, attest that this documentation has been prepared under the direction and in the presence of Meredith Staggers, MD. Electronically Signed: Stann Ore, Scribe. 04/02/2018 , 2:43 PM .  Patient was seen in Room 8 .   Patient ID: Dustin Weber, male    DOB: [birth year], 27 y.o.   MRN: [id number] Chief Complaint  Patient presents with  . Cough    going on for 2 days   . Shortness of Breath    comes and goes and constant yesterday   . Headache  . Anorexia  . Chills    this am only   HPI Dustin Weber is a 26 y.o. male  Here with multiple symptoms as above for the past 2 days. He has a history of asthma, and has used albuterol as needed. He also has a history of tobacco use.   Patient states symptoms started on Sunday night (2 days ago). He denies any fever or body aches initially 2 days ago. He woke up yesterday morning feeling worse, with worst dry cough yesterday morning with body aches in his back and his chest wall. He also notes having loss of appetite, and hasn't been drinking fluids as much as usual. He states rare use of albuterol prior to sickness. He used it once yesterday morning. His cough and shortness of breath have improved, but still present. He states his daughter (56 years old, Marcos Eke) also has similar symptoms that started over the weekend as well.   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 He worked yesterday but for an hour because he coughed so much, felt like he was going to vomit.   Patient Active Problem List   Diagnosis Date Noted  . Elevated creatine phosphokinase level 10/26/2016  . Myalgia 10/26/2016  . Asthma 07/08/2015   Past Medical History:  Diagnosis Date  . Asthma    No past surgical history on file. No Known Allergies Prior to Admission medications   Medication Sig Start Date End Date Taking? Authorizing Provider  albuterol (PROVENTIL HFA;VENTOLIN HFA) 108 (90 Base) MCG/ACT  inhaler Inhale 1-2 puffs into the lungs every 6 (six) hours as needed for wheezing or shortness of breath. 08/06/17   Wallis Bamberg, PA-C  celecoxib (CELEBREX) 200 MG capsule Take 1 capsule (200 mg total) by mouth daily. 01/04/18   Wallis Bamberg, PA-C  cyclobenzaprine (FLEXERIL) 5 MG tablet Take 1 tablet (5 mg total) by mouth 3 (three) times daily as needed for muscle spasms. 12/06/17   Wallis Bamberg, PA-C  traMADol-acetaminophen (ULTRACET) 37.5-325 MG tablet Take 1 tablet by mouth every 8 (eight) hours as needed for severe pain. 12/06/17   Wallis Bamberg, PA-C   Social History   Socioeconomic History  . Marital status: Single    Spouse name: Not on file  . Number of children: Not on file  . Years of education: Not on file  . Highest education level: Not on file  Occupational History  . Not on file  Social Needs  . Financial resource strain: Not on file  . Food insecurity:    Worry: Not on file    Inability: Not on file  . Transportation needs:    Medical: Not on file    Non-medical: Not on file  Tobacco Use  . Smoking status: Current Every Day Smoker    Packs/day: 0.25    Years: 3.00    Pack years: 0.75  Types: Cigarettes    Last attempt to quit: 09/28/2017    Years since quitting: 0.5  . Smokeless tobacco: Never Used  Substance and Sexual Activity  . Alcohol use: Yes    Comment: occasional  . Drug use: Yes    Frequency: 7.0 times per week    Types: Marijuana    Comment: daily  . Sexual activity: Never  Lifestyle  . Physical activity:    Days per week: Not on file    Minutes per session: Not on file  . Stress: Not on file  Relationships  . Social connections:    Talks on phone: Not on file    Gets together: Not on file    Attends religious service: Not on file    Active member of club or organization: Not on file    Attends meetings of clubs or organizations: Not on file    Relationship status: Not on file  . Intimate partner violence:    Fear of current or ex partner: Not  on file    Emotionally abused: Not on file    Physically abused: Not on file    Forced sexual activity: Not on file  Other Topics Concern  . Not on file  Social History Narrative  . Not on file   Review of Systems  Constitutional: Positive for appetite change and chills. Negative for fatigue and unexpected weight change.  Eyes: Negative for visual disturbance.  Respiratory: Positive for cough and shortness of breath. Negative for chest tightness.   Cardiovascular: Negative for chest pain, palpitations and leg swelling.  Gastrointestinal: Negative for abdominal pain and blood in stool.  Neurological: Positive for headaches. Negative for dizziness and light-headedness.       Objective:   Physical Exam  Constitutional: He is oriented to person, place, and time. He appears well-developed and well-nourished.  HENT:  Head: Normocephalic and atraumatic.  Right Ear: Tympanic membrane, external ear and ear canal normal.  Left Ear: Tympanic membrane, external ear and ear canal normal.  Nose: No rhinorrhea. Right sinus exhibits no maxillary sinus tenderness and no frontal sinus tenderness. Left sinus exhibits no maxillary sinus tenderness and no frontal sinus tenderness.  Mouth/Throat: Oropharynx is clear and moist and mucous membranes are normal. No oropharyngeal exudate or posterior oropharyngeal erythema.  Eyes: Pupils are equal, round, and reactive to light. Conjunctivae are normal.  Neck: Neck supple.  Cardiovascular: Normal rate, regular rhythm, normal heart sounds and intact distal pulses.  No murmur heard. Pulmonary/Chest: Effort normal and breath sounds normal. He has no wheezes. He has no rhonchi. He has no rales.  Abdominal: Soft. There is no tenderness.  Lymphadenopathy:    He has no cervical adenopathy.  Neurological: He is alert and oriented to person, place, and time.  Skin: Skin is warm and dry. No rash noted.  Psychiatric: He has a normal mood and affect. His behavior is  normal.  Vitals reviewed.   Vitals:   04/02/18 1423  BP: 133/87  Pulse: (!) 103  Temp: 99 F (37.2 C)  TempSrc: Oral  SpO2: 98%  Weight: 131 lb 12.8 oz (59.8 kg)  Height: 5\' 7"  (1.702 m)   Results for orders placed or performed in visit on 04/02/18  POCT Influenza A/B  Result Value Ref Range   Influenza A, POC Negative Negative   Influenza B, POC Negative Negative       Assessment & Plan:    Dustin Weber is a 27 y.o. male Acute upper respiratory  infection Flu-like symptoms - Plan: POCT Influenza A/B Cough - Plan: benzonatate (TESSALON) 100 MG capsule  -Suspected viral respiratory infection, flu testing negative.  Some improvement of symptoms today reassuring, as well as vital signs.  No apparent wheeze on exam but history of asthma.  -Symptomatic care discussed, Tessalon Perles 3 times daily as needed, albuterol as needed with RTC precautions for frequent/persistent use.  Cigarette nicotine dependence without complication  -Contemplating quitting.  Handout given on steps to quit smoking, but advised to follow-up if assistance needed  Mild intermittent asthma, unspecified whether complicated - Plan: albuterol (PROVENTIL HFA;VENTOLIN HFA) 108 (90 Base) MCG/ACT inhaler  -Controlled by infrequent need for albuterol prior to current illness.  Albuterol refilled with RTC precautions if increased use during acute illness or after illness  Meds ordered this encounter  Medications  . benzonatate (TESSALON) 100 MG capsule    Sig: Take 1 capsule (100 mg total) by mouth 3 (three) times daily as needed for cough.    Dispense:  20 capsule    Refill:  0  . albuterol (PROVENTIL HFA;VENTOLIN HFA) 108 (90 Base) MCG/ACT inhaler    Sig: Inhale 1-2 puffs into the lungs every 6 (six) hours as needed for wheezing or shortness of breath.    Dispense:  8.5 Inhaler    Refill:  0   Patient Instructions    Flu test was negative today.  I suspected to have a viral upper respiratory  infection that should continue to improve.  Tessalon Perles if needed up to 3 times per day for cough, albuterol if needed for wheezing.  If you require albuterol more than a few times per day, or persistently require albuterol in the next 3 to 4 days, return for recheck as other medication may be needed.  Make sure to drink plenty of fluids, rest as needed as well.  Return to the clinic or go to the nearest emergency room if any of your symptoms worsen or new symptoms occur.  See information below regarding quitting smoking.  I am happy to help you with that if resources are needed.  Thank you for coming in today.  Upper Respiratory Infection, Adult Most upper respiratory infections (URIs) are caused by a virus. A URI affects the nose, throat, and upper air passages. The most common type of URI is often called "the common cold." Follow these instructions at home:  Take medicines only as told by your doctor.  Gargle warm saltwater or take cough drops to comfort your throat as told by your doctor.  Use a warm mist humidifier or inhale steam from a shower to increase air moisture. This may make it easier to breathe.  Drink enough fluid to keep your pee (urine) clear or pale yellow.  Eat soups and other clear broths.  Have a healthy diet.  Rest as needed.  Go back to work when your fever is gone or your doctor says it is okay. ? You may need to stay home longer to avoid giving your URI to others. ? You can also wear a face mask and wash your hands often to prevent spread of the virus.  Use your inhaler more if you have asthma.  Do not use any tobacco products, including cigarettes, chewing tobacco, or electronic cigarettes. If you need help quitting, ask your doctor. Contact a doctor if:  You are getting worse, not better.  Your symptoms are not helped by medicine.  You have chills.  You are getting more short of breath.  You have brown or red mucus.  You have yellow or brown  discharge from your nose.  You have pain in your face, especially when you bend forward.  You have a fever.  You have puffy (swollen) neck glands.  You have pain while swallowing.  You have white areas in the back of your throat. Get help right away if:  You have very bad or constant: ? Headache. ? Ear pain. ? Pain in your forehead, behind your eyes, and over your cheekbones (sinus pain). ? Chest pain.  You have long-lasting (chronic) lung disease and any of the following: ? Wheezing. ? Long-lasting cough. ? Coughing up blood. ? A change in your usual mucus.  You have a stiff neck.  You have changes in your: ? Vision. ? Hearing. ? Thinking. ? Mood. This information is not intended to replace advice given to you by your health care provider. Make sure you discuss any questions you have with your health care provider. Document Released: 12/13/2007 Document Revised: 02/27/2016 Document Reviewed: 10/01/2013 Elsevier Interactive Patient Education  2018 [employer].   Steps to Quit Smoking Smoking tobacco can be bad for your health. It can also affect almost every organ in your body. Smoking puts you and people around you at risk for many serious long-lasting (chronic) diseases. Quitting smoking is hard, but it is one of the best things that you can do for your health. It is never too late to quit. What are the benefits of quitting smoking? When you quit smoking, you lower your risk for getting serious diseases and conditions. They can include:  Lung cancer or lung disease.  Heart disease.  Stroke.  Heart attack.  Not being able to have children (infertility).  Weak bones (osteoporosis) and broken bones (fractures).  If you have coughing, wheezing, and shortness of breath, those symptoms may get better when you quit. You may also get sick less often. If you are pregnant, quitting smoking can help to lower your chances of having a baby of low birth weight. What can I  do to help me quit smoking? Talk with your doctor about what can help you quit smoking. Some things you can do (strategies) include:  Quitting smoking totally, instead of slowly cutting back how much you smoke over a period of time.  Going to in-person counseling. You are more likely to quit if you go to many counseling sessions.  Using resources and support systems, such as: ? Agricultural engineer with a Veterinary surgeon. ? Phone quitlines. ? Automotive engineer. ? Support groups or group counseling. ? Text messaging programs. ? Mobile phone apps or applications.  Taking medicines. Some of these medicines may have nicotine in them. If you are pregnant or breastfeeding, do not take any medicines to quit smoking unless your doctor says it is okay. Talk with your doctor about counseling or other things that can help you.  Talk with your doctor about using more than one strategy at the same time, such as taking medicines while you are also going to in-person counseling. This can help make quitting easier. What things can I do to make it easier to quit? Quitting smoking might feel very hard at first, but there is a lot that you can do to make it easier. Take these steps:  Talk to your family and friends. Ask them to support and encourage you.  Call phone quitlines, reach out to support groups, or work with a Veterinary surgeon.  Ask people who smoke to not  smoke around you.  Avoid places that make you want (trigger) to smoke, such as: ? Bars. ? Parties. ? Smoke-break areas at work.  Spend time with people who do not smoke.  Lower the stress in your life. Stress can make you want to smoke. Try these things to help your stress: ? Getting regular exercise. ? Deep-breathing exercises. ? Yoga. ? Meditating. ? Doing a body scan. To do this, close your eyes, focus on one area of your body at a time from head to toe, and notice which parts of your body are tense. Try to relax the muscles in those  areas.  Download or buy apps on your mobile phone or tablet that can help you stick to your quit plan. There are many free apps, such as QuitGuide from the [employer] developer for Disease Control and Prevention). You can find more support from smokefree.gov and other websites.  This information is not intended to replace advice given to you by your health care provider. Make sure you discuss any questions you have with your health care provider. Document Released: 04/22/2009 Document Revised: 02/22/2016 Document Reviewed: 11/10/2014 Elsevier Interactive Patient Education  [employer].   If you have lab work done today you will be contacted with your lab results within the next 2 weeks.  If you have not heard from [place] then please contact us. The fastest way to get your results is to register for My Chart.   IF you received an x-ray today, you will receive an invoice from [facility]. Please contact [facility] at [phone] with questions or concerns regarding your invoice.   IF you received labwork today, you will receive an invoice from [facility]. Please contact [facility] at [phone] with questions or concerns regarding your invoice.   Our billing staff will not be able to assist you with questions regarding bills from these companies.  You will be contacted with the lab results as soon as they are available. The fastest way to get your results is to activate your My Chart account. Instructions are located on the last page of this paperwork. If you have not heard from [place] regarding the results in 2 weeks, please contact this office.       I personally performed the services described in this documentation, which was scribed in my presence. The recorded information has been reviewed and considered for accuracy and completeness, addended by me as needed, and agree with information above.  Signed,   Meredith Staggers, MD Primary [facility] at [facility].   04/02/18 2:54 PM

## 2018-10-01 ENCOUNTER — Encounter: Payer: Self-pay | Admitting: Allergy and Immunology

## 2018-10-01 ENCOUNTER — Ambulatory Visit: Payer: BLUE CROSS/BLUE SHIELD | Admitting: Allergy and Immunology

## 2018-10-01 ENCOUNTER — Other Ambulatory Visit: Payer: Self-pay

## 2018-10-01 VITALS — BP 100/60 | HR 76 | Temp 98.4°F | Resp 16 | Ht 67.0 in | Wt 137.0 lb

## 2018-10-01 DIAGNOSIS — J4540 Moderate persistent asthma, uncomplicated: Secondary | ICD-10-CM | POA: Diagnosis not present

## 2018-10-01 DIAGNOSIS — F17210 Nicotine dependence, cigarettes, uncomplicated: Secondary | ICD-10-CM

## 2018-10-01 DIAGNOSIS — J3089 Other allergic rhinitis: Secondary | ICD-10-CM | POA: Diagnosis not present

## 2018-10-01 MED ORDER — BUDESONIDE-FORMOTEROL FUMARATE 160-4.5 MCG/ACT IN AERO: 2.0000 | INHALATION_SPRAY | Freq: Two times a day (BID) | RESPIRATORY_TRACT | 5 refills | Status: AC

## 2018-10-01 NOTE — Progress Notes (Signed)
[place]   NEW PATIENT NOTE  Referring Provider: No ref. provider found Primary Provider: No primary care provider on file. Date of office visit: 10/01/2018    Subjective:   Chief Complaint:  Dustin Weber (DOB: 14-Mar-1991) is a 28 y.o. male who presents to the clinic on 10/01/2018 with a chief complaint of Cough .  HPI: Dustin Weber presents to this clinic in evaluation of breathing problems.  Apparently had a history of childhood asthma that for the most part completely resolved and he rarely used a short acting bronchodilator and never had visited a physician or urgent care center with any type of respiratory tract issue for years.  However, sometime in mid January he developed a prolonged episode of coughing associated with posttussive emesis for which [facility] went [facility] and an x-ray apparently identified no significant abnormality and he was given albuterol and prednisone.  He is a lot better at this point in time.  When he did use his albuterol he definitely did have a response regarding his cough.  Now he just has some intermittent cough that may be a little bit more common when he is exposed to dust at work.  In addition, he does complain about having an itchiness in his throat and throat clearing that sometimes gives him a slight cough.  He does drink caffeine only intermittently.  He does not have any classic reflux symptoms.  He does have a history of nasal congestion and sneezing sometimes during the winter and spring and sometimes the fall that does not really require any specific therapy.  He does have a headache that is bilateral and either frontal or temporal that is pounding and pressure-like and there may be some intermittent spots in his vision and some intermittent dizziness with his headache and may be some very rare nausea with these headaches.  Difficult to say about the frequency of these headaches but it does not sound  as though he has them any greater than just a few times a month and they do not really cause him to stop functioning.  If he has an opportunity sometimes he will lay down for relief and sometimes he will take a Motrin.  There are no other associated neurological symptoms with this headache.  He does smoke tobacco products from about the age of 28 until current.  Past Medical History:  Diagnosis Date  . Asthma     History reviewed. No pertinent surgical history.  Allergies as of 10/01/2018   No Known Allergies     Medication List      albuterol 108 (90 Base) MCG/ACT inhaler Commonly known as:  PROVENTIL HFA;VENTOLIN HFA Inhale 1-2 puffs into the lungs every 6 (six) hours as needed for wheezing or shortness of breath.   benzonatate 100 MG capsule Commonly known as:  TESSALON Take 1 capsule (100 mg total) by mouth 3 (three) times daily as needed for cough.   brompheniramine-pseudoephedrine-DM 30-2-10 MG/5ML syrup TAKE 10 ML BY MOUTH EVERY 6 HOURS AS NEEDED       Review of systems negative except as noted in HPI / PMHx or noted below:  Review of Systems  Constitutional: Negative.   HENT: Negative.   Eyes: Negative.   Respiratory: Negative.   Cardiovascular: Negative.   Gastrointestinal: Negative.   Genitourinary: Negative.   Musculoskeletal: Negative.   Skin: Negative.   Neurological: Negative.   Endo/Heme/Allergies: Negative.   Psychiatric/Behavioral: Negative.  Family History  Problem Relation Age of Onset  . Diabetes Maternal Grandmother     Social History   Socioeconomic History  . Marital status: Single    Spouse name: Not on file  . Number of children: Not on file  . Years of education: Not on file  . Highest education level: Not on file  Occupational History  . Not on file  Social Needs  . Financial resource strain: Not on file  . Food insecurity:    Worry: Not on file    Inability: Not on file  . Transportation needs:    Medical: Not on file     Non-medical: Not on file  Tobacco Use  . Smoking status: Current Some Day Smoker    Packs/day: 0.25    Years: 4.00    Pack years: 1.00    Types: Cigarettes    Last attempt to quit: 09/28/2017    Years since quitting: 1.0  . Smokeless tobacco: Never Used  Substance and Sexual Activity  . Alcohol use: Yes    Comment: occasional  . Drug use: Yes    Frequency: 7.0 times per week    Types: Marijuana    Comment: daily  . Sexual activity: Never  Lifestyle  . Physical activity:    Days per week: Not on file    Minutes per session: Not on file  . Stress: Not on file  Relationships  . Social connections:    Talks on phone: Not on file    Gets together: Not on file    Attends religious service: Not on file    Active member of club or organization: Not on file    Attends meetings of clubs or organizations: Not on file    Relationship status: Not on file  . Intimate partner violence:    Fear of current or ex partner: Not on file    Emotionally abused: Not on file    Physically abused: Not on file    Forced sexual activity: Not on file  Other Topics Concern  . Not on file  Social History Narrative  . 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  Objective:   Vitals:   10/01/18 1357  BP: 100/60  Pulse: 76  Resp: 16  Temp: 98.4 F (36.9 C)  SpO2: 97%   Height: 5\' 7"  (170.2 cm) Weight: 137 lb (62.1 kg)  Physical Exam Constitutional:      Appearance: He is not diaphoretic.  HENT:     Head: Normocephalic. No right periorbital erythema or left periorbital erythema.     Right Ear: Tympanic membrane, ear canal and external ear normal.     Left Ear: Tympanic membrane, ear canal and external ear normal.     Nose: Nose normal. No  mucosal edema or rhinorrhea.     Mouth/Throat:     Pharynx: Uvula midline. No oropharyngeal exudate.  Eyes:     General: Lids are normal.     Conjunctiva/sclera: Conjunctivae normal.     Pupils: Pupils are equal, round, and reactive to light.  Neck:     Thyroid: No thyromegaly.     Trachea: Trachea normal. No tracheal tenderness or tracheal deviation.  Cardiovascular:     Rate and  Rhythm: Normal rate and regular rhythm.     Heart sounds: Normal heart sounds, S1 normal and S2 normal. No murmur.  Pulmonary:     Effort: Pulmonary effort is normal. No respiratory distress.     Breath sounds: Normal breath sounds. No stridor. No wheezing or rales.  Chest:     Chest wall: No tenderness.  Abdominal:     General: There is no distension.     Palpations: Abdomen is soft. There is no mass.     Tenderness: There is no abdominal tenderness. There is no guarding or rebound.  Musculoskeletal:        General: No tenderness.  Lymphadenopathy:     Head:     Right side of head: No tonsillar adenopathy.     Left side of head: No tonsillar adenopathy.     Cervical: No cervical adenopathy.  Skin:    Coloration: Skin is not pale.     Findings: No erythema or rash.     Nails: There is no clubbing.   Neurological:     Mental Status: He is alert.     Diagnostics: Allergy skin tests were performed.  He demonstrated hypersensitivity to house dust mite.  Spirometry was performed and demonstrated an FEV1 of 2.75 @ 71 % of predicted. FEV1/FVC = 0.69  Assessment and Plan:    1. Not well controlled moderate persistent asthma   2. Perennial allergic rhinitis   3. Tobacco smoker, less than 10 cigarettes per day     1.  Allergen avoidance measures  2.  Treat and prevent inflammation:   A.  Symbicort 160-2 inhalations twice a day with spacer  B.  OTC Nasacort-1 spray each nostril daily  3.  If needed:   A.  Albuterol HFA - 2 inhalations every 4-6 hours  B.  OTC antihistamine -Claritin/Zyrtec  10 mg 1 time per day  4.  Return to clinic in 4 weeks or earlier if problem  5.  Use nicotine substitutes to replace tobacco smoke exposure  I will start Anees on preventative anti-inflammatory agents for his airway as noted above and I have asked him to find a nicotine substitute to replace his tobacco smoke exposure and he will perform allergen avoidance measures against house dust mite.  I will see him back in this clinic in 4 weeks to assess his response to therapy and consider further evaluation and treatment based upon this response.  Laurette Schimke, MD Allergy / Immunology [facility]

## 2018-10-01 NOTE — Patient Instructions (Addendum)
  1.  Allergen avoidance measures  2.  Treat and prevent inflammation:   A.  Symbicort 160-2 inhalations twice a day with spacer  B.  OTC Nasacort-1 spray each nostril daily  3.  If needed:   A.  Albuterol HFA - 2 inhalations every 4-6 hours  B.  OTC antihistamine -Claritin/Zyrtec 10 mg 1 time per day  4.  Return to clinic in 4 weeks or earlier if problem  5.  Use nicotine substitutes to replace tobacco smoke exposure

## 2018-10-02 ENCOUNTER — Encounter: Payer: Self-pay | Admitting: Allergy and Immunology

## 2018-11-05 ENCOUNTER — Ambulatory Visit: Payer: BLUE CROSS/BLUE SHIELD | Admitting: Allergy and Immunology

## 2018-12-10 ENCOUNTER — Ambulatory Visit: Payer: BLUE CROSS/BLUE SHIELD | Admitting: Allergy and Immunology

## 2019-10-03 ENCOUNTER — Ambulatory Visit (HOSPITAL_COMMUNITY)
Admission: EM | Admit: 2019-10-03 | Discharge: 2019-10-03 | Disposition: A | Discharge status: Home / Self Care | Payer: BC Managed Care – PPO | Attending: Physician Assistant | Admitting: Physician Assistant

## 2019-10-03 ENCOUNTER — Other Ambulatory Visit: Payer: Self-pay

## 2019-10-03 ENCOUNTER — Ambulatory Visit (INDEPENDENT_AMBULATORY_CARE_PROVIDER_SITE_OTHER): Payer: BC Managed Care – PPO

## 2019-10-03 ENCOUNTER — Encounter (HOSPITAL_COMMUNITY): Payer: Self-pay

## 2019-10-03 DIAGNOSIS — M7989 Other specified soft tissue disorders: Secondary | ICD-10-CM | POA: Diagnosis not present

## 2019-10-03 DIAGNOSIS — M79632 Pain in left forearm: Secondary | ICD-10-CM | POA: Diagnosis not present

## 2019-10-03 DIAGNOSIS — S5012XA Contusion of left forearm, initial encounter: Secondary | ICD-10-CM | POA: Diagnosis not present

## 2019-10-03 NOTE — ED Provider Notes (Signed)
[name]    CSN: [id number] Arrival date & time: 10/03/19  1711      History   Chief Complaint Chief Complaint  Patient presents with  . Arm Injury    HPI Dustin Weber is a 29 y.o. male.   Patient reports he had his left forearm slammed in a metal door yesterday.  He had pain and swelling immediately after.  He reports some pain with movement of his wrist and elbow..  No numbness or tingling in his hand.     Past Medical History:  Diagnosis Date  . Asthma     Patient Active Problem List   Diagnosis Date Noted  . Elevated creatine phosphokinase level 10/26/2016  . Myalgia 10/26/2016  . Asthma 07/08/2015    History reviewed. No pertinent surgical history.     Home Medications    Prior to Admission medications   Medication Sig Start Date End Date Taking? Authorizing Provider  albuterol (PROVENTIL HFA;VENTOLIN HFA) 108 (90 Base) MCG/ACT inhaler Inhale 1-2 puffs into the lungs every 6 (six) hours as needed for wheezing or shortness of breath. 04/02/18   Wendie Agreste, MD  benzonatate (TESSALON) 100 MG capsule Take 1 capsule (100 mg total) by mouth 3 (three) times daily as needed for cough. 04/02/18   Wendie Agreste, MD  brompheniramine-pseudoephedrine-DM 30-2-10 MG/5ML syrup TAKE 10 ML BY MOUTH EVERY 6 HOURS AS NEEDED 09/02/18   [provider]  budesonide-formoterol (SYMBICORT) 160-4.5 MCG/ACT inhaler Inhale 2 puffs into the lungs 2 (two) times daily. 10/01/18   Kozlow, Donnamarie Poag, MD    Family History Family History  Problem Relation Age of Onset  . Diabetes Maternal Grandmother     Social History Social History   Tobacco Use  . Smoking status: Current Some Day Smoker    Packs/day: 0.25    Years: 4.00    Pack years: 1.00    Types: Cigarettes    Last attempt to quit: 09/28/2017    Years since quitting: 2.0  . Smokeless tobacco: Never Used  Substance Use Topics  . Alcohol use: Yes    Comment: occasional  . Drug use: Yes   Frequency: 7.0 times per week    Types: Marijuana    Comment: daily     Allergies   Patient has no known allergies.   Review of Systems Review of Systems  Skin: Positive for color change. Negative for wound.  Neurological: Negative for weakness and numbness.     Physical Exam Triage Vital Signs ED Triage Vitals  Enc Vitals Group     BP 10/03/19 1740 119/80     Pulse Rate 10/03/19 1740 78     Resp --      Temp 10/03/19 1740 98.3 F (36.8 C)     Temp Source 10/03/19 1740 Oral     SpO2 10/03/19 1740 100 %     Weight 10/03/19 1739 140 lb (63.5 kg)     Height --      Head Circumference --      Peak Flow --      Pain Score 10/03/19 1739 8     Pain Loc --      Pain Edu? --      Excl. in [place]? --    No data found.  Updated Vital Signs BP 119/80 (BP Location: Right Arm)   Pulse 78   Temp 98.3 F (36.8 C) (Oral)   Wt 140 lb (63.5 kg)   SpO2 100%  BMI 21.93 kg/m   Visual Acuity Right Eye Distance:   Left Eye Distance:   Bilateral Distance:    Right Eye Near:   Left Eye Near:    Bilateral Near:     Physical Exam Vitals and nursing note reviewed.  Constitutional:      Appearance: He is well-developed.  HENT:     Head: Normocephalic and atraumatic.  Eyes:     Conjunctiva/sclera: Conjunctivae normal.  Cardiovascular:     Rate and Rhythm: Normal rate.     Heart sounds: No murmur.  Pulmonary:     Effort: Pulmonary effort is normal. No respiratory distress.  Musculoskeletal:     Cervical back: Neck supple.     Comments: Left forearm -swelling and ecchymosis over distal one third of radius and ulna shaft.  Tenderness to palpation over similar area.  There is no tenderness palpation over the distal radius or ulnar head.  No tenderness in the carpal bones.  Some pain with flexion extension of the elbow in flexion extension of the wrist.  No crepitus noticed on exam.  Cap refill intact and sensation intact distally.  Skin:    General: Skin is warm and dry.    Neurological:     General: No focal deficit present.     Mental Status: He is alert and oriented to person, place, and time.      UC Treatments / Results  Labs (all labs ordered are listed, but only abnormal results are displayed) Labs Reviewed - No data to display  EKG   Radiology DG Forearm Left  Result Date: 10/03/2019 CLINICAL DATA:  Left forearm injury in middle door yesterday with swelling and pain. EXAM: LEFT FOREARM - 2 VIEW COMPARISON:  None. FINDINGS: Distal left forearm soft tissue swelling. No fracture. No focal osseous lesions. No radiopaque foreign body. IMPRESSION: Distal left forearm soft tissue swelling, with no fracture. Electronically Signed   By: Delbert Phenix M.D.   On: 10/03/2019 19:06    Procedures Procedures (including critical care time)  Medications Ordered in UC Medications - No data to display  Initial Impression / Assessment and Plan / UC Course  I have reviewed the triage vital signs and the nursing notes.  Pertinent labs & imaging results that were available during my care of the patient were reviewed by me and considered in my medical decision making (see chart for details).     #forearm contusion Patient 29 year old male presenting with trauma to left forearm.  X-ray without evidence of fracture.  Consistent with contusion.  Instructed to ice and elevate tonight.  Tylenol and ibuprofen for pain.  Discussed signs of compartment syndrome such as worsening pain, numbness tingling loss of feeling in his hand, discussed that this is not likely however it is something that he needs to be aware of.  Patient verbalized understanding.  Final Clinical Impressions(s) / UC Diagnoses   Final diagnoses:  Contusion of left forearm, initial encounter     Discharge Instructions     There was no fracture per radiology read and my read.  Ice the area and elevate it tonight.  2 regular strength Tylenol and 2 regular strength ibuprofen to help with  pain.  Your only limitations will be based on your own pain.  If pain significantly worsens over the next few days, you have loss of sensation in your hand or significant amount of swelling over the forearm please return for reevaluation.      ED Prescriptions  None     PDMP not reviewed this encounter.   Hermelinda Medicus, PA-C 10/03/19 1949

## 2019-10-03 NOTE — ED Triage Notes (Signed)
Patient's arm got closed in house door last night.

## 2019-10-03 NOTE — Discharge Instructions (Addendum)
There was no fracture per radiology read and my read.  Ice the area and elevate it tonight.  2 regular strength Tylenol and 2 regular strength ibuprofen to help with pain.  Your only limitations will be based on your own pain.  If pain significantly worsens over the next few days, you have loss of sensation in your hand or significant amount of swelling over the forearm please return for reevaluation.

## 2019-12-12 ENCOUNTER — Encounter (HOSPITAL_COMMUNITY): Payer: Self-pay | Admitting: Emergency Medicine

## 2019-12-12 ENCOUNTER — Ambulatory Visit (HOSPITAL_COMMUNITY)
Admission: EM | Admit: 2019-12-12 | Discharge: 2019-12-12 | Disposition: A | Discharge status: Home / Self Care | Payer: BC Managed Care – PPO | Attending: Urgent Care | Admitting: Urgent Care

## 2019-12-12 ENCOUNTER — Other Ambulatory Visit: Payer: Self-pay

## 2019-12-12 DIAGNOSIS — M79631 Pain in right forearm: Secondary | ICD-10-CM | POA: Diagnosis not present

## 2019-12-12 DIAGNOSIS — Z23 Encounter for immunization: Secondary | ICD-10-CM | POA: Diagnosis not present

## 2019-12-12 DIAGNOSIS — S51811A Laceration without foreign body of right forearm, initial encounter: Secondary | ICD-10-CM | POA: Diagnosis not present

## 2019-12-12 MED ORDER — TETANUS-DIPHTH-ACELL PERTUSSIS 5-2.5-18.5 LF-MCG/0.5 IM SUSP
0.5000 mL | Freq: Once | INTRAMUSCULAR | Status: AC
  Administered 2019-12-12: 0.5 mL via INTRAMUSCULAR

## 2019-12-12 MED ORDER — LIDOCAINE-EPINEPHRINE (PF) 2 %-1:200000 IJ SOLN
INTRAMUSCULAR | Status: AC
  Filled 2019-12-12: qty 20

## 2019-12-12 MED ORDER — TETANUS-DIPHTH-ACELL PERTUSSIS 5-2.5-18.5 LF-MCG/0.5 IM SUSP
INTRAMUSCULAR | Status: AC
  Filled 2019-12-12: qty 0.5

## 2019-12-12 NOTE — Discharge Instructions (Signed)

## 2019-12-12 NOTE — ED Triage Notes (Signed)
Pt c/o laceration to right forearm last night. He states he has had a tetanus shot. Pt states he cut his arm on a metal piece of hardware. Bleeding is controlled.

## 2019-12-12 NOTE — ED Provider Notes (Signed)
[facility]   MRN: [id number] DOB: Aug 09, 1990  Subjective:   Dustin Weber is a 29 y.o. male presenting for right forearm laceration last night. Patient works for [employer] and cut his arm while working. Has kept his wound clean, covered. Cannot recall his last tdap.   No current facility-administered medications for this encounter.  Current Outpatient Medications:  .  albuterol (PROVENTIL HFA;VENTOLIN HFA) 108 (90 Base) MCG/ACT inhaler, Inhale 1-2 puffs into the lungs every 6 (six) hours as needed for wheezing or shortness of breath., Disp: 8.5 Inhaler, Rfl: 0 .  budesonide-formoterol (SYMBICORT) 160-4.5 MCG/ACT inhaler, Inhale 2 puffs into the lungs 2 (two) times daily., Disp: 1 Inhaler, Rfl: 5 .  benzonatate (TESSALON) 100 MG capsule, Take 1 capsule (100 mg total) by mouth 3 (three) times daily as needed for cough., Disp: 20 capsule, Rfl: 0 .  brompheniramine-pseudoephedrine-DM 30-2-10 MG/5ML syrup, TAKE 10 ML BY MOUTH EVERY 6 HOURS AS NEEDED, Disp: , Rfl:    No Known Allergies  Past Medical History:  Diagnosis Date  . Asthma      History reviewed. No pertinent surgical history.  Family History  Problem Relation Age of Onset  . Diabetes Maternal Grandmother     Social History   Tobacco Use  . Smoking status: Current Some Day Smoker    Packs/day: 0.25    Years: 4.00    Pack years: 1.00    Types: Cigarettes    Last attempt to quit: 09/28/2017    Years since quitting: 2.2  . Smokeless tobacco: Never Used  Substance Use Topics  . Alcohol use: Yes    Comment: occasional  . Drug use: Yes    Frequency: 7.0 times per week    Types: Marijuana    Comment: daily    ROS   Objective:   Vitals: BP 112/73 (BP Location: Left Arm)   Pulse 73   Temp 98.6 F (37 C) (Oral)   Resp 18   SpO2 98%   Physical Exam Constitutional:      General: He is not in acute distress.    Appearance: Normal appearance. He is well-developed and normal weight. He is not  ill-appearing, toxic-appearing or diaphoretic.  HENT:     Head: Normocephalic and atraumatic.     Right Ear: External ear normal.     Left Ear: External ear normal.     Nose: Nose normal.     Mouth/Throat:     Pharynx: Oropharynx is clear.  Eyes:     General: No scleral icterus.       Right eye: No discharge.        Left eye: No discharge.     Extraocular Movements: Extraocular movements intact.     Pupils: Pupils are equal, round, and reactive to light.  Cardiovascular:     Rate and Rhythm: Normal rate.  Pulmonary:     Effort: Pulmonary effort is normal.  Musculoskeletal:       Arms:     Cervical back: Normal range of motion.  Skin:    General: Skin is warm and dry.  Neurological:     Mental Status: He is alert and oriented to person, place, and time.  Psychiatric:        Mood and Affect: Mood normal.        Behavior: Behavior normal.        Thought Content: Thought content normal.        Judgment: Judgment normal.  PROCEDURE NOTE: laceration repair Verbal consent obtained from patient.  Local anesthesia with 8cc Lidocaine 2% with epinephrine.  Wound explored for tendon, ligament damage. Wound scrubbed with soap and water and rinsed. Wound closed with #6 4-0 Prolene (4 simple interrupted, one horizontal mattress and one subcu dog ear) sutures.  Wound cleansed and dressed.    Assessment and Plan :   PDMP not reviewed this encounter.  1. Pain of right forearm   2. Laceration of right forearm, initial encounter     Laceration repaired successfully. Wound care reviewed.  Use Tylenol and/or ibuprofen for pain relief.  Return-to-clinic precautions discussed, patient verbalized understanding. Otherwise, follow up in 10 days for suture removal.     Wallis Bamberg, PA-C 12/12/19 1941

## 2020-02-19 ENCOUNTER — Other Ambulatory Visit: Payer: Self-pay

## 2020-02-19 ENCOUNTER — Encounter (HOSPITAL_COMMUNITY): Payer: Self-pay | Admitting: Emergency Medicine

## 2020-02-19 ENCOUNTER — Ambulatory Visit (HOSPITAL_COMMUNITY)
Admission: EM | Admit: 2020-02-19 | Discharge: 2020-02-19 | Disposition: A | Discharge status: Home / Self Care | Payer: BC Managed Care – PPO | Attending: Family Medicine | Admitting: Family Medicine

## 2020-02-19 DIAGNOSIS — R112 Nausea with vomiting, unspecified: Secondary | ICD-10-CM

## 2020-02-19 DIAGNOSIS — R197 Diarrhea, unspecified: Secondary | ICD-10-CM

## 2020-02-19 MED ORDER — ONDANSETRON 4 MG PO TBDP: 4.0000 mg | ORAL_TABLET | Freq: Three times a day (TID) | ORAL | 0 refills | Status: DC | PRN

## 2020-02-19 NOTE — Discharge Instructions (Signed)
Your nausea, vomiting, and diarrhea appear to have a viral cause. Your symptoms should improve over the next week as your body continues to rid the infectious cause. ° °For nausea: Zofran prescribed. Begin with every 6 hours, than as you are able to hold food down, take it as needed. Start with clear liquids, then move to plain foods like bananas, rice, applesauce, toast, broth, grits, oatmeal. As those food settle okay you may transition to your normal foods. Avoid spicy and greasy foods as much as possible. ° °For Diarrhea: This is your body's natural way of getting rid of a virus. You may try taking 1 imodium or pepto bismol to decrease amount of stools a day, but we do not want you to stop your diarrhea.  ° °Preventing dehydration is key! You need to replace the fluid your body is expelling. Drink plenty of fluids, may use Pedialyte or sports drinks.  ° °Please return if you are experiencing blood in your vomit or stool or experiencing dizziness, lightheadedness, extreme fatigue, increased abdominal pain.  °

## 2020-02-19 NOTE — ED Triage Notes (Signed)
Pt present with N,V,D and abdominal pain, and chills that started yesterday. States "feels like air in my chest". Denies fever, congestion, cough, lost of taste or smell.   Denies covid test at this time, would like to discuss with provider further.

## 2020-02-21 NOTE — ED Provider Notes (Signed)
[facility]    CSN: [id number] Arrival date & time: 02/19/20  1759      History   Chief Complaint Chief Complaint  Patient presents with  . Emesis    HPI Dustin Weber is a 29 y.o. male presenting today for evaluation of nausea vomiting and diarrhea.  Patient reports that yesterday began to feel chilled, with abdominal pain and had a few episodes of vomiting.  Symptoms resolved.  Attempted eating some pasta cereal, symptoms returned again today.  He denies any fevers.  Denies uri symptoms.  Denies close contacts with similar symptoms.  HPI  Past Medical History:  Diagnosis Date  . Asthma     Patient Active Problem List   Diagnosis Date Noted  . Elevated creatine phosphokinase level 10/26/2016  . Myalgia 10/26/2016  . Asthma 07/08/2015    History reviewed. No pertinent surgical history.     Home Medications    Prior to Admission medications   Medication Sig Start Date End Date Taking? Authorizing Provider  benzonatate (TESSALON) 100 MG capsule Take 1 capsule (100 mg total) by mouth 3 (three) times daily as needed for cough. 04/02/18   Shade Flood, MD  brompheniramine-pseudoephedrine-DM 30-2-10 MG/5ML syrup TAKE 10 ML BY MOUTH EVERY 6 HOURS AS NEEDED 09/02/18   [provider]  budesonide-formoterol (SYMBICORT) 160-4.5 MCG/ACT inhaler Inhale 2 puffs into the lungs 2 (two) times daily. 10/01/18   Kozlow, Alvira Philips, MD  ondansetron (ZOFRAN ODT) 4 MG disintegrating tablet Take 1 tablet (4 mg total) by mouth every 8 (eight) hours as needed for nausea or vomiting. 02/19/20   Artemis Loyal C, PA-C  albuterol (PROVENTIL HFA;VENTOLIN HFA) 108 (90 Base) MCG/ACT inhaler Inhale 1-2 puffs into the lungs every 6 (six) hours as needed for wheezing or shortness of breath. 04/02/18 02/19/20  Shade Flood, MD    Family History Family History  Problem Relation Age of Onset  . Diabetes Maternal Grandmother     Social History Social History   Tobacco Use    . Smoking status: Current Some Day Smoker    Packs/day: 0.25    Years: 4.00    Pack years: 1.00    Types: Cigarettes    Last attempt to quit: 09/28/2017    Years since quitting: 2.4  . Smokeless tobacco: Never Used  Vaping Use  . Vaping Use: Never used  Substance Use Topics  . Alcohol use: Yes    Comment: occasional  . Drug use: Yes    Frequency: 7.0 times per week    Types: Marijuana    Comment: daily     Allergies   Patient has no known allergies.   Review of Systems Review of Systems  Constitutional: Negative for activity change, appetite change, chills, fatigue and fever.  HENT: Negative for congestion, ear pain, rhinorrhea, sinus pressure, sore throat and trouble swallowing.   Eyes: Negative for discharge and redness.  Respiratory: Negative for cough, chest tightness and shortness of breath.   Cardiovascular: Negative for chest pain.  Gastrointestinal: Positive for abdominal pain, diarrhea, nausea and vomiting.  Musculoskeletal: Negative for myalgias.  Skin: Negative for rash.  Neurological: Negative for dizziness, light-headedness and headaches.     Physical Exam Triage Vital Signs ED Triage Vitals  Enc Vitals Group     BP 02/19/20 1925 124/72     Pulse Rate 02/19/20 1925 63     Resp 02/19/20 1925 17     Temp 02/19/20 1925 98.3 F (36.8 C)  Temp Source 02/19/20 1925 Oral     SpO2 02/19/20 1925 100 %     Weight --      Height --      Head Circumference --      Peak Flow --      Pain Score 02/19/20 1922 0     Pain Loc --      Pain Edu? --      Excl. in GC? --    No data found.  Updated Vital Signs BP 124/72 (BP Location: Right Arm)   Pulse 63   Temp 98.3 F (36.8 C) (Oral)   Resp 17   SpO2 100%   Visual Acuity Right Eye Distance:   Left Eye Distance:   Bilateral Distance:    Right Eye Near:   Left Eye Near:    Bilateral Near:     Physical Exam Vitals and nursing note reviewed.  Constitutional:      Appearance: He is  well-developed.     Comments: No acute distress  HENT:     Head: Normocephalic and atraumatic.     Nose: Nose normal.     Mouth/Throat:     Comments: Oral mucosa pink and moist, no tonsillar enlargement or exudate. Posterior pharynx patent and nonerythematous, no uvula deviation or swelling. Normal phonation.  Eyes:     Conjunctiva/sclera: Conjunctivae normal.  Cardiovascular:     Rate and Rhythm: Normal rate.  Pulmonary:     Effort: Pulmonary effort is normal. No respiratory distress.     Comments: Breathing comfortably at rest, CTABL, no wheezing, rales or other adventitious sounds auscultated  Abdominal:     General: There is no distension.     Comments: Soft, nondistended tender to light and deep palpation throughout entire abdomen  Musculoskeletal:        General: Normal range of motion.     Cervical back: Neck supple.  Skin:    General: Skin is warm and dry.  Neurological:     Mental Status: He is alert and oriented to person, place, and time.      UC Treatments / Results  Labs (all labs ordered are listed, but only abnormal results are displayed) Labs Reviewed - No data to display  EKG   Radiology No results found.  Procedures Procedures (including critical care time)  Medications Ordered in UC Medications - No data to display  Initial Impression / Assessment and Plan / UC Course  I have reviewed the triage vital signs and the nursing notes.  Pertinent labs & imaging results that were available during my care of the patient were reviewed by me and considered in my medical decision making (see chart for details).     GI symptoms x2 days, no abdominal tenderness during evaluation today.  Covid test pending.  Suspect likely viral etiology and recommending symptomatic and supportive care.  Rest and fluids.  Zofran for nausea.  Discussed strict return precautions. Patient verbalized understanding and is agreeable with plan.  Final Clinical Impressions(s) /  UC Diagnoses   Final diagnoses:  Nausea vomiting and diarrhea     Discharge Instructions     Your nausea, vomiting, and diarrhea appear to have a viral cause. Your symptoms should improve over the next week as your body continues to rid the infectious cause.  For nausea: Zofran prescribed. Begin with every 6 hours, than as you are able to hold food down, take it as needed. Start with clear liquids, then move to plain foods like  bananas, rice, applesauce, toast, broth, grits, oatmeal. As those food settle okay you may transition to your normal foods. Avoid spicy and greasy foods as much as possible.  For Diarrhea: This is your body's natural way of getting rid of a virus. You may try taking 1 imodium or pepto bismol to decrease amount of stools a day, but we do not want you to stop your diarrhea.   Preventing dehydration is key! You need to replace the fluid your body is expelling. Drink plenty of fluids, may use Pedialyte or sports drinks.   Please return if you are experiencing blood in your vomit or stool or experiencing dizziness, lightheadedness, extreme fatigue, increased abdominal pain.    ED Prescriptions    Medication Sig Dispense Auth. Provider   ondansetron (ZOFRAN ODT) 4 MG disintegrating tablet Take 1 tablet (4 mg total) by mouth every 8 (eight) hours as needed for nausea or vomiting. 30 tablet Beaulah Romanek, Clinton C, PA-C     PDMP not reviewed this encounter.   Lew Dawes, PA-C 02/21/20 1212

## 2020-02-22 ENCOUNTER — Ambulatory Visit (HOSPITAL_COMMUNITY)
Admission: EM | Admit: 2020-02-22 | Discharge: 2020-02-22 | Disposition: A | Discharge status: Home / Self Care | Payer: BC Managed Care – PPO | Attending: Physician Assistant | Admitting: Physician Assistant

## 2020-02-22 ENCOUNTER — Other Ambulatory Visit: Payer: Self-pay

## 2020-02-22 DIAGNOSIS — Z20822 Contact with and (suspected) exposure to covid-19: Secondary | ICD-10-CM | POA: Insufficient documentation

## 2020-02-22 NOTE — ED Triage Notes (Signed)
Pt was here Thurs and got swabbed for COVID, but the results never resulted. Pt wants another COVID test.

## 2020-02-23 LAB — SARS CORONAVIRUS 2 (TAT 6-24 HRS): SARS Coronavirus 2: NEGATIVE

## 2020-09-30 ENCOUNTER — Ambulatory Visit (HOSPITAL_COMMUNITY)
Admission: EM | Admit: 2020-09-30 | Discharge: 2020-09-30 | Disposition: A | Discharge status: Home / Self Care | Payer: BC Managed Care – PPO | Attending: Urgent Care | Admitting: Urgent Care

## 2020-09-30 ENCOUNTER — Encounter (HOSPITAL_COMMUNITY): Payer: Self-pay | Admitting: Urgent Care

## 2020-09-30 ENCOUNTER — Other Ambulatory Visit: Payer: Self-pay

## 2020-09-30 DIAGNOSIS — B353 Tinea pedis: Secondary | ICD-10-CM

## 2020-09-30 DIAGNOSIS — L853 Xerosis cutis: Secondary | ICD-10-CM

## 2020-09-30 DIAGNOSIS — L299 Pruritus, unspecified: Secondary | ICD-10-CM

## 2020-09-30 DIAGNOSIS — R519 Headache, unspecified: Secondary | ICD-10-CM | POA: Diagnosis not present

## 2020-09-30 DIAGNOSIS — Z7251 High risk heterosexual behavior: Secondary | ICD-10-CM

## 2020-09-30 MED ORDER — HYDROCORTISONE 1 % EX CREA
TOPICAL_CREAM | CUTANEOUS | 0 refills | Status: AC
Start: 2020-09-30 — End: ?

## 2020-09-30 MED ORDER — PSEUDOEPHEDRINE HCL 30 MG PO TABS
30.0000 mg | ORAL_TABLET | Freq: Three times a day (TID) | ORAL | 0 refills | Status: AC | PRN
Start: 2020-09-30 — End: ?

## 2020-09-30 MED ORDER — FLUTICASONE PROPIONATE 50 MCG/ACT NA SUSP
2.0000 | Freq: Every day | NASAL | 0 refills | Status: AC
Start: 2020-09-30 — End: ?

## 2020-09-30 MED ORDER — KETOCONAZOLE 2 % EX CREA: 1.0000 "application " | TOPICAL_CREAM | Freq: Every day | CUTANEOUS | 0 refills | Status: DC

## 2020-09-30 MED ORDER — CETIRIZINE HCL 10 MG PO TABS
10.0000 mg | ORAL_TABLET | Freq: Every day | ORAL | 0 refills | Status: AC
Start: 2020-09-30 — End: ?

## 2020-09-30 NOTE — Discharge Instructions (Signed)
We will be using Flonase and Zyrtec to address your headaches for sinus headaches. Use these medications daily long term. You can use Sudafed (pseudoephedrine) as need for break through symptoms. I don't recommended using Sudafed daily however. Make sure you hydrate very well with at least 2 liters of water daily. For your skin, you can use hydrocortisone cream 1% twice daily. Apply light layers and use up to a week before stopping for 1 week. For the toes, lets use ketoconazole cream to address a fungal infection. This is once daily for 2 weeks.

## 2020-09-30 NOTE — ED Provider Notes (Signed)
[facility]   MRN: [id number] DOB: 1991/03/14  Subjective:   Dustin Weber is a 30 y.o. male presenting for recheck on sexually transmitted infections.  Patient states that he went to a health clinic and was tested.  He ended up taking doxycycline and metronidazole but reports that he still has some intermittent testicle pain and wants to make sure that he is clear of the infections he had.  He did not get an injection of Rocephin.  States that he did complete his medications.  He is also had intermittent persistent headaches the past couple of weeks, intermittent stuffy nose, dry skin around his mouth with itching and stinging at times.  He is also had itching of his toes worse after he showers.  Reports that symptoms started after he got his boots very wet and more than the rest that day.  He has not tried any medications for relief.  Denies sinus pain, sore throat, cough, chest pain, shortness of breath, nausea, vomiting, belly pain, pelvic pain, testicular swelling, dysuria, urinary frequency, hematuria, penile discharge.  No current facility-administered medications for this encounter.  Current Outpatient Medications:  .  benzonatate (TESSALON) 100 MG capsule, Take 1 capsule (100 mg total) by mouth 3 (three) times daily as needed for cough., Disp: 20 capsule, Rfl: 0 .  brompheniramine-pseudoephedrine-DM 30-2-10 MG/5ML syrup, TAKE 10 ML BY MOUTH EVERY 6 HOURS AS NEEDED, Disp: , Rfl:  .  budesonide-formoterol (SYMBICORT) 160-4.5 MCG/ACT inhaler, Inhale 2 puffs into the lungs 2 (two) times daily., Disp: 1 Inhaler, Rfl: 5 .  ondansetron (ZOFRAN ODT) 4 MG disintegrating tablet, Take 1 tablet (4 mg total) by mouth every 8 (eight) hours as needed for nausea or vomiting., Disp: 30 tablet, Rfl: 0   No Known Allergies  Past Medical History:  Diagnosis Date  . Asthma      No past surgical history on file.  Family History  Problem Relation Age of Onset  . Diabetes Maternal  Grandmother     Social History   Tobacco Use  . Smoking status: Current Some Day Smoker    Packs/day: 0.25    Years: 4.00    Pack years: 1.00    Types: Cigarettes    Last attempt to quit: 09/28/2017    Years since quitting: 3.0  . Smokeless tobacco: Never Used  Vaping Use  . Vaping Use: Never used  Substance Use Topics  . Alcohol use: Yes    Comment: occasional  . Drug use: Yes    Frequency: 7.0 times per week    Types: Marijuana    Comment: daily    ROS   Objective:   Vitals: BP 120/86 (BP Location: Right Arm)   Pulse 72   Temp 98.2 F (36.8 C) (Oral)   Resp 16   SpO2 100%   Physical Exam Constitutional:      General: He is not in acute distress.    Appearance: Normal appearance. He is well-developed and normal weight. He is not ill-appearing, toxic-appearing or diaphoretic.  HENT:     Head: Normocephalic and atraumatic.     Right Ear: Tympanic membrane, ear canal and external ear normal. There is no impacted cerumen.     Left Ear: Tympanic membrane, ear canal and external ear normal. There is no impacted cerumen.     Nose: Nose normal. No congestion or rhinorrhea.     Mouth/Throat:     Mouth: Mucous membranes are moist.     Pharynx: Oropharynx  is clear. No oropharyngeal exudate or posterior oropharyngeal erythema.  Eyes:     General: No scleral icterus.       Right eye: No discharge.        Left eye: No discharge.     Extraocular Movements: Extraocular movements intact.     Conjunctiva/sclera: Conjunctivae normal.     Pupils: Pupils are equal, round, and reactive to light.  Cardiovascular:     Rate and Rhythm: Normal rate.  Pulmonary:     Effort: Pulmonary effort is normal.  Musculoskeletal:     Cervical back: Normal range of motion and neck supple. No rigidity. No muscular tenderness.  Skin:    General: Skin is warm and dry.     Comments: Hyperkeratotic toenails bilaterally.  Slight maceration of the skin in between his toes.  Dry scaly skin on  either side of his mouth.  Neurological:     General: No focal deficit present.     Mental Status: He is alert and oriented to person, place, and time.     Cranial Nerves: No cranial nerve deficit.     Motor: No weakness.     Coordination: Coordination normal.     Gait: Gait normal.     Deep Tendon Reflexes: Reflexes normal.  Psychiatric:        Mood and Affect: Mood normal.        Behavior: Behavior normal.        Thought Content: Thought content normal.        Judgment: Judgment normal.      Assessment and Plan :   PDMP not reviewed this encounter.  1. Frequent headaches   2. Dry skin   3. Itching   4. Unprotected sex   5. Tinea pedis of both feet     Will manage his headaches for sinus headaches/allergic rhinitis with Flonase, Zyrtec and Sudafed prn. Use hydrocortisone for a dry skin dermatitis, eczematous type problem of the face. Hydrate well, follow up with PCP on this. Start ketoconazole cream for tinea pedis. STI testing pending. Counseled patient on potential for adverse effects with medications prescribed/recommended today, ER and return-to-clinic precautions discussed, patient verbalized understanding.    Wallis Bamberg, PA-C 09/30/20 1109

## 2020-09-30 NOTE — ED Triage Notes (Signed)
Pt reports headache, low appetite,  dry skin around the mouth and itchiness in the toes after taking showers x 1 month. Pt has not taking OTC meds for complaints.  Pt requested STD testing. Denies fever, penile discharge, dysuria.

## 2020-11-20 ENCOUNTER — Encounter (HOSPITAL_COMMUNITY): Payer: Self-pay | Admitting: Emergency Medicine

## 2020-11-20 ENCOUNTER — Other Ambulatory Visit: Payer: Self-pay

## 2020-11-20 ENCOUNTER — Ambulatory Visit (HOSPITAL_COMMUNITY)
Admission: EM | Admit: 2020-11-20 | Discharge: 2020-11-20 | Disposition: A | Discharge status: Home / Self Care | Payer: BC Managed Care – PPO

## 2020-11-20 DIAGNOSIS — S61214A Laceration without foreign body of right ring finger without damage to nail, initial encounter: Secondary | ICD-10-CM | POA: Diagnosis not present

## 2020-11-20 NOTE — Discharge Instructions (Signed)
Cover with bandage at work, use a small amount of Vaseline on wound to aid wound healing.

## 2020-11-20 NOTE — ED Provider Notes (Signed)
[facility]    CSN: [id number] Arrival date & time: 11/20/20  1021      History   Chief Complaint Chief Complaint  Patient presents with  . Finger Injury    HPI Dustin Weber is a 30 y.o. male.   Patient with no significant PMH here c/w laceration R ring finger x 4 days ago, cut finger on piece of metal.  He reports his tetanus is UTD.  He has not seen any other provider for this before.  Denies pain, tenderness, swelling, erythema, purulent discharge, n/t, weakness.  He wants to make sure it's not infected.     Past Medical History:  Diagnosis Date  . Asthma     Patient Active Problem List   Diagnosis Date Noted  . Elevated creatine phosphokinase level 10/26/2016  . Myalgia 10/26/2016  . Asthma 07/08/2015    History reviewed. No pertinent surgical history.     Home Medications    Prior to Admission medications   Medication Sig Start Date End Date Taking? Authorizing Provider  benzonatate (TESSALON) 100 MG capsule Take 1 capsule (100 mg total) by mouth 3 (three) times daily as needed for cough. 04/02/18   Shade Flood, MD  brompheniramine-pseudoephedrine-DM 30-2-10 MG/5ML syrup TAKE 10 ML BY MOUTH EVERY 6 HOURS AS NEEDED 09/02/18   [provider]  budesonide-formoterol (SYMBICORT) 160-4.5 MCG/ACT inhaler Inhale 2 puffs into the lungs 2 (two) times daily. 10/01/18   Kozlow, Alvira Philips, MD  cetirizine (ZYRTEC ALLERGY) 10 MG tablet Take 1 tablet (10 mg total) by mouth daily. 09/30/20   Wallis Bamberg, PA-C  fluticasone (FLONASE) 50 MCG/ACT nasal spray Place 2 sprays into both nostrils daily. 09/30/20   Wallis Bamberg, PA-C  hydrocortisone cream 1 % Apply to affected area 2 times daily 09/30/20   Wallis Bamberg, PA-C  ketoconazole (NIZORAL) 2 % cream Apply 1 application topically daily. 09/30/20   Wallis Bamberg, PA-C  ondansetron (ZOFRAN ODT) 4 MG disintegrating tablet Take 1 tablet (4 mg total) by mouth every 8 (eight) hours as needed for nausea or vomiting.  02/19/20   Wieters, Hallie C, PA-C  pseudoephedrine (SUDAFED) 30 MG tablet Take 1 tablet (30 mg total) by mouth every 8 (eight) hours as needed for congestion. 09/30/20   Wallis Bamberg, PA-C  albuterol (PROVENTIL HFA;VENTOLIN HFA) 108 (90 Base) MCG/ACT inhaler Inhale 1-2 puffs into the lungs every 6 (six) hours as needed for wheezing or shortness of breath. 04/02/18 02/19/20  Shade Flood, MD    Family History Family History  Problem Relation Age of Onset  . Diabetes Maternal Grandmother     Social History Social History   Tobacco Use  . Smoking status: Current Some Day Smoker    Packs/day: 0.25    Years: 4.00    Pack years: 1.00    Types: Cigarettes    Last attempt to quit: 09/28/2017    Years since quitting: 3.1  . Smokeless tobacco: Never Used  Vaping Use  . Vaping Use: Never used  Substance Use Topics  . Alcohol use: Yes    Comment: occasional  . Drug use: Yes    Frequency: 7.0 times per week    Types: Marijuana    Comment: daily     Allergies   Patient has no known allergies.   Review of Systems Review of Systems  Constitutional: Negative for chills, fatigue and fever.  Gastrointestinal: Negative for nausea and vomiting.  Musculoskeletal: Negative for arthralgias and myalgias.  Skin: Positive for  wound. Negative for color change.  Neurological: Negative for weakness and numbness.  Hematological: Negative for adenopathy. Does not bruise/bleed easily.  Psychiatric/Behavioral: Negative for sleep disturbance.     Physical Exam Triage Vital Signs ED Triage Vitals  Enc Vitals Group     BP 11/20/20 1109 135/88     Pulse Rate 11/20/20 1109 72     Resp 11/20/20 1109 16     Temp 11/20/20 1109 99.1 F (37.3 C)     Temp Source 11/20/20 1109 Oral     SpO2 11/20/20 1109 98 %     Weight --      Height --      Head Circumference --      Peak Flow --      Pain Score 11/20/20 1111 0     Pain Loc --      Pain Edu? --      Excl. in GC? --    No data  found.  Updated Vital Signs BP 135/88 (BP Location: Right Arm)   Pulse 72   Temp 99.1 F (37.3 C) (Oral)   Resp 16   SpO2 98%   Visual Acuity Right Eye Distance:   Left Eye Distance:   Bilateral Distance:    Right Eye Near:   Left Eye Near:    Bilateral Near:     Physical Exam Vitals and nursing note reviewed.  Constitutional:      General: He is not in acute distress.    Appearance: Normal appearance. He is not ill-appearing.  HENT:     Head: Normocephalic and atraumatic.  Eyes:     General: No scleral icterus.    Extraocular Movements: Extraocular movements intact.     Conjunctiva/sclera: Conjunctivae normal.  Pulmonary:     Effort: Pulmonary effort is normal. No respiratory distress.  Musculoskeletal:     Right hand: Laceration (1 cm long, appears to be superfical in depth. wound edges well approximated and wound is closed. laceration is transverse over palmer aspect R ring finger.  no erythema, no swelling, non-tender, no purulent discharge) present. No swelling, deformity, tenderness or bony tenderness. Normal range of motion. Normal strength. Normal sensation. Normal capillary refill. Normal pulse.     Cervical back: Normal range of motion. No rigidity.  Skin:    Coloration: Skin is not jaundiced.     Findings: No rash.  Neurological:     General: No focal deficit present.     Mental Status: He is alert and oriented to person, place, and time.     Motor: No weakness.     Gait: Gait normal.  Psychiatric:        Mood and Affect: Mood normal.        Behavior: Behavior normal.      UC Treatments / Results  Labs (all labs ordered are listed, but only abnormal results are displayed) Labs Reviewed - No data to display  EKG   Radiology No results found.  Procedures Procedures (including critical care time)  Medications Ordered in UC Medications - No data to display  Initial Impression / Assessment and Plan / UC Course  I have reviewed the triage vital  signs and the nursing notes.  Pertinent labs & imaging results that were available during my care of the patient were reviewed by me and considered in my medical decision making (see chart for details).     Finger healing well, no signs of infection Recommend cover at work, keep clean, wash with warm  soapy water Follow up here 1 week PRN, sooner with new or worsening symptoms. Final Clinical Impressions(s) / UC Diagnoses   Final diagnoses:  Laceration of right ring finger without foreign body without damage to nail, initial encounter     Discharge Instructions     Cover with bandage at work, use a small amount of Vaseline on wound to aid wound healing.    ED Prescriptions    None     PDMP not reviewed this encounter.   Evern Core, PA-C 11/20/20 1129

## 2020-11-20 NOTE — ED Triage Notes (Signed)
Pt presents with laceration to right finger that he cut on piece of metal 4 days ago. Pt is up to date on Tdap.

## 2021-01-06 ENCOUNTER — Other Ambulatory Visit: Payer: Self-pay

## 2021-01-06 ENCOUNTER — Encounter (HOSPITAL_COMMUNITY): Payer: Self-pay

## 2021-01-06 ENCOUNTER — Ambulatory Visit (HOSPITAL_COMMUNITY)
Admission: EM | Admit: 2021-01-06 | Discharge: 2021-01-06 | Disposition: A | Discharge status: Home / Self Care | Payer: BC Managed Care – PPO | Attending: Emergency Medicine | Admitting: Emergency Medicine

## 2021-01-06 DIAGNOSIS — M79604 Pain in right leg: Secondary | ICD-10-CM

## 2021-01-06 DIAGNOSIS — I8311 Varicose veins of right lower extremity with inflammation: Secondary | ICD-10-CM

## 2021-01-06 DIAGNOSIS — M79605 Pain in left leg: Secondary | ICD-10-CM

## 2021-01-06 DIAGNOSIS — I8312 Varicose veins of left lower extremity with inflammation: Secondary | ICD-10-CM | POA: Diagnosis not present

## 2021-01-06 DIAGNOSIS — M545 Low back pain, unspecified: Secondary | ICD-10-CM

## 2021-01-06 DIAGNOSIS — G8929 Other chronic pain: Secondary | ICD-10-CM

## 2021-01-06 MED ORDER — IBUPROFEN 800 MG PO TABS
ORAL_TABLET | ORAL | Status: AC
  Filled 2021-01-06: qty 1

## 2021-01-06 MED ORDER — IBUPROFEN 800 MG PO TABS
800.0000 mg | ORAL_TABLET | Freq: Once | ORAL | Status: AC
  Administered 2021-01-06: 18:00:00 800 mg via ORAL

## 2021-01-06 NOTE — Discharge Instructions (Signed)
Please follow-up with your PCP, obtain some compression stockings to wear.  May use over-the-counter Biofreeze, Tylenol or ibuprofen for discomfort.

## 2021-01-06 NOTE — ED Notes (Signed)
Pt mentioned having intermittent feeling that heart is beating fast with intermittent chest pains. Clancy Gourd NP notified, no further orders at this time.

## 2021-01-06 NOTE — ED Provider Notes (Signed)
[facility]    CSN: [id number] Arrival date & time: 01/06/21  1611      History   Chief Complaint Chief Complaint  Patient presents with   Leg Pain    HPI Dustin Weber is a 30 y.o. male.   30 year old male patient presents to urgent care with chief complaint of bilateral leg pain for years.  Patient denies any injury , states he has 2 knots on either side of his leg, pain with standing, works for [employer] lots of lifting turning twisting.  No treatment tried, no loss of bowel and bladder  The history is provided by the patient. No language interpreter was used.   Past Medical History:  Diagnosis Date   Asthma     Patient Active Problem List   Diagnosis Date Noted   Elevated creatine phosphokinase level 10/26/2016   Myalgia 10/26/2016   Asthma 07/08/2015    History reviewed. No pertinent surgical history.     Home Medications    Prior to Admission medications   Medication Sig Start Date End Date Taking? Authorizing Provider  benzonatate (TESSALON) 100 MG capsule Take 1 capsule (100 mg total) by mouth 3 (three) times daily as needed for cough. 04/02/18   Shade Flood, MD  brompheniramine-pseudoephedrine-DM 30-2-10 MG/5ML syrup TAKE 10 ML BY MOUTH EVERY 6 HOURS AS NEEDED 09/02/18   [provider]  budesonide-formoterol (SYMBICORT) 160-4.5 MCG/ACT inhaler Inhale 2 puffs into the lungs 2 (two) times daily. 10/01/18   Kozlow, Alvira Philips, MD  cetirizine (ZYRTEC ALLERGY) 10 MG tablet Take 1 tablet (10 mg total) by mouth daily. 09/30/20   Wallis Bamberg, PA-C  fluticasone (FLONASE) 50 MCG/ACT nasal spray Place 2 sprays into both nostrils daily. 09/30/20   Wallis Bamberg, PA-C  hydrocortisone cream 1 % Apply to affected area 2 times daily 09/30/20   Wallis Bamberg, PA-C  ketoconazole (NIZORAL) 2 % cream Apply 1 application topically daily. 09/30/20   Wallis Bamberg, PA-C  ondansetron (ZOFRAN ODT) 4 MG disintegrating tablet Take 1 tablet (4 mg total) by mouth every 8 (eight)  hours as needed for nausea or vomiting. 02/19/20   Wieters, Hallie C, PA-C  pseudoephedrine (SUDAFED) 30 MG tablet Take 1 tablet (30 mg total) by mouth every 8 (eight) hours as needed for congestion. 09/30/20   Wallis Bamberg, PA-C  albuterol (PROVENTIL HFA;VENTOLIN HFA) 108 (90 Base) MCG/ACT inhaler Inhale 1-2 puffs into the lungs every 6 (six) hours as needed for wheezing or shortness of breath. 04/02/18 02/19/20  Shade Flood, MD    Family History Family History  Problem Relation Age of Onset   Diabetes Maternal Grandmother     Social History Social History   Tobacco Use   Smoking status: Some Days    Packs/day: 0.25    Years: 4.00    Pack years: 1.00    Types: Cigarettes    Last attempt to quit: 09/28/2017    Years since quitting: 3.2   Smokeless tobacco: Never  Vaping Use   Vaping Use: Never used  Substance Use Topics   Alcohol use: Yes    Comment: occasional   Drug use: Yes    Frequency: 7.0 times per week    Types: Marijuana    Comment: daily     Allergies   Patient has no known allergies.   Review of Systems Review of Systems  Musculoskeletal:  Positive for myalgias.  Skin:  Negative for color change, pallor, rash and wound.  All other systems reviewed  and are negative.   Physical Exam Triage Vital Signs ED Triage Vitals  Enc Vitals Group     BP 01/06/21 1709 132/82     Pulse Rate 01/06/21 1709 75     Resp 01/06/21 1709 16     Temp 01/06/21 1709 98.9 F (37.2 C)     Temp Source 01/06/21 1709 Oral     SpO2 01/06/21 1709 100 %     Weight --      Height --      Head Circumference --      Peak Flow --      Pain Score 01/06/21 1710 7     Pain Loc --      Pain Edu? --      Excl. in GC? --    No data found.  Updated Vital Signs BP 132/82 (BP Location: Right Arm)   Pulse 75   Temp 98.9 F (37.2 C) (Oral)   Resp 16   SpO2 100%   Visual Acuity Right Eye Distance:   Left Eye Distance:   Bilateral Distance:    Right Eye Near:   Left Eye Near:     Bilateral Near:     Physical Exam Vitals and nursing note reviewed.  Constitutional:      Appearance: Normal appearance. He is well-developed.  HENT:     Head: Normocephalic and atraumatic.  Eyes:     Conjunctiva/sclera: Conjunctivae normal.  Cardiovascular:     Rate and Rhythm: Normal rate and regular rhythm.     Pulses:          Dorsalis pedis pulses are 2+ on the right side and 2+ on the left side.     Heart sounds: Normal heart sounds. No murmur heard. Pulmonary:     Effort: Pulmonary effort is normal. No respiratory distress.     Breath sounds: Normal breath sounds.  Abdominal:     Palpations: Abdomen is soft.     Tenderness: There is no abdominal tenderness.  Musculoskeletal:     Cervical back: Neck supple.       Legs:     Comments: Bilateral lower leg swelling with standing as charted.  DP +2 bilaterally  Skin:    General: Skin is warm and dry.     Capillary Refill: Capillary refill takes less than 2 seconds.  Neurological:     General: No focal deficit present.     Mental Status: He is alert and oriented to person, place, and time.     GCS: GCS eye subscore is 4. GCS verbal subscore is 5. GCS motor subscore is 6.     Cranial Nerves: Cranial nerves are intact.     Sensory: Sensation is intact.     Motor: Motor function is intact.  Psychiatric:        Attention and Perception: Attention normal.        Mood and Affect: Mood normal.        Speech: Speech normal.        Behavior: Behavior normal. Behavior is cooperative.     UC Treatments / Results  Labs (all labs ordered are listed, but only abnormal results are displayed) Labs Reviewed - No data to display  EKG   Radiology No results found.  Procedures Procedures (including critical care time)  Medications Ordered in UC Medications  ibuprofen (ADVIL) tablet 800 mg (800 mg Oral Given 01/06/21 1817)    Initial Impression / Assessment and Plan / UC Course  I have  reviewed the triage vital signs and  the nursing notes.  Pertinent labs & imaging results that were available during my care of the patient were reviewed by me and considered in my medical decision making (see chart for details).     Ddx: Varicose veins, back strain ,sciatica Final Clinical Impressions(s) / UC Diagnoses   Final diagnoses:  Chronic pain of both lower extremities  Varicose veins of both lower extremities with inflammation  Acute low back pain without sciatica, unspecified back pain laterality     Discharge Instructions      Please follow-up with your PCP, obtain some compression stockings to wear.  May use over-the-counter Biofreeze, Tylenol or ibuprofen for discomfort.     ED Prescriptions   None    PDMP not reviewed this encounter.   Clancy Gourd, NP 01/06/21 1930

## 2021-01-06 NOTE — ED Triage Notes (Signed)
Pt present right leg pain, pt state that he has two knots on the both legs that are also painful. Pt denies any injury to the leg.

## 2022-01-19 IMAGING — DX DG FOREARM 2V*L*
2 series · 2 of 2 positions shown · non-contrast
Comparison: None.

CLINICAL DATA: Left forearm injury in middle door yesterday with
swelling and pain.

EXAM:
LEFT FOREARM - 2 VIEW

[forearm ap]
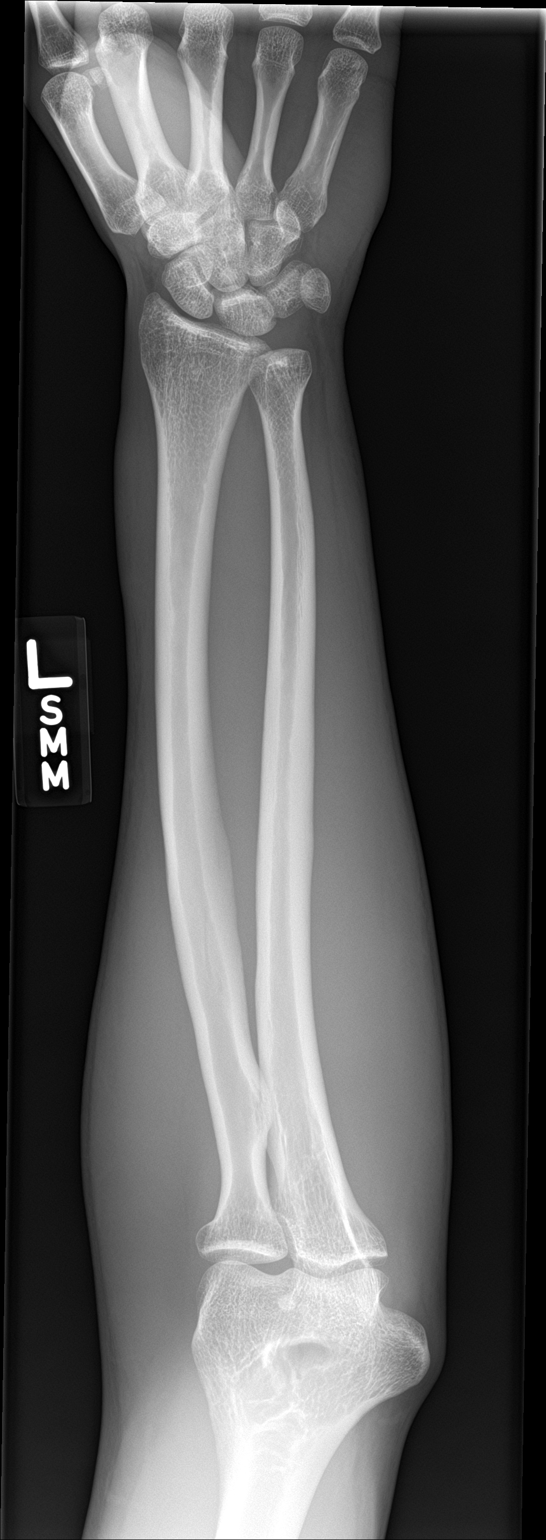

[forearm lat]
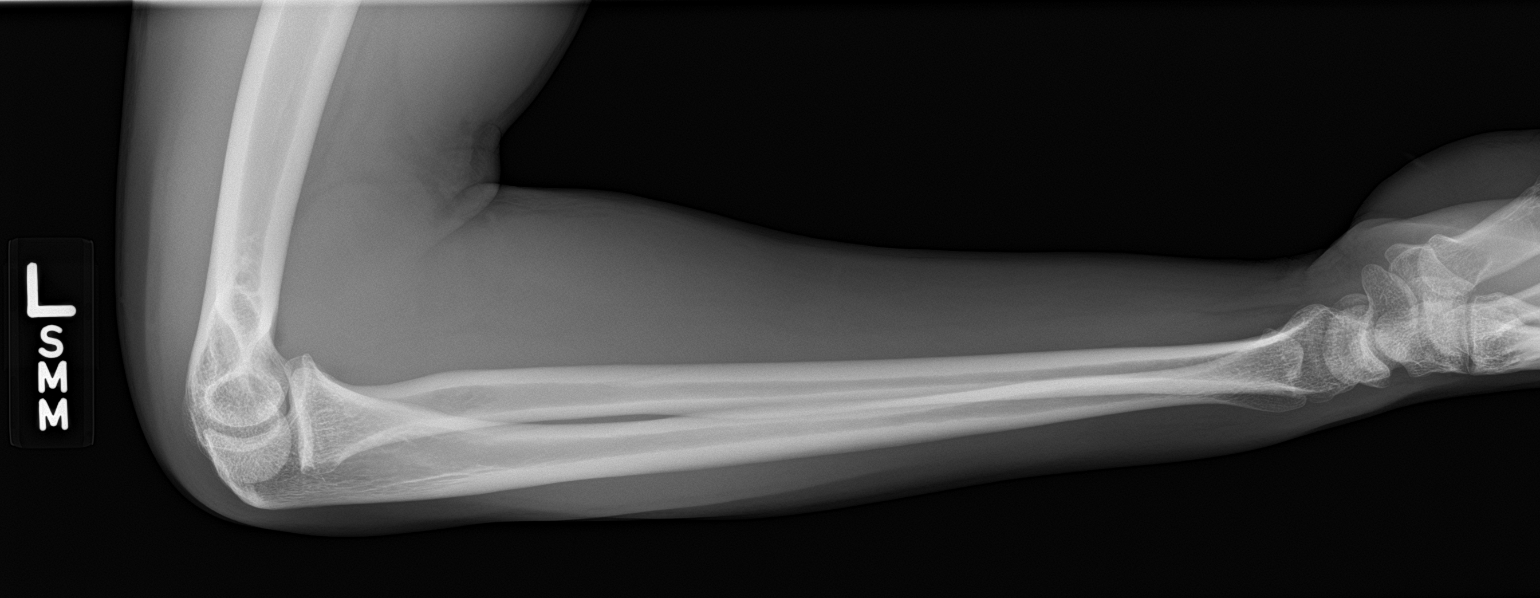

[2 of 2 positions shown; findings below may reference images not displayed]

FINDINGS: Distal left forearm soft tissue swelling. No fracture. No focal
osseous lesions. No radiopaque foreign body.
IMPRESSION: Distal left forearm soft tissue swelling, with no fracture.

## 2023-03-30 ENCOUNTER — Ambulatory Visit
Admission: EM | Admit: 2023-03-30 | Discharge: 2023-03-30 | Disposition: A | Discharge status: Home / Self Care | Payer: BC Managed Care – PPO | Attending: Internal Medicine | Admitting: Internal Medicine

## 2023-03-30 DIAGNOSIS — S61411A Laceration without foreign body of right hand, initial encounter: Secondary | ICD-10-CM

## 2023-03-30 NOTE — ED Triage Notes (Signed)
Pt states he cut the top of his right hand with a piece of glass.  Laceration noted to top of right hand,bleeding controlled.

## 2023-03-30 NOTE — ED Provider Notes (Signed)
[facility]    CSN: [id number] Arrival date & time: 03/30/23  1242      History   Chief Complaint Chief Complaint  Patient presents with   Laceration    HPI Dustin Weber is a 32 y.o. male presents for evaluation of a laceration.  Patient reports 30 minutes PTA he was washing a glass in the sink when it broke causing him to cut his right hand.  He states he applied pressure and came in for evaluation.  He denies any blood thinning medications.  No numbness/tingling/weakness of the injured area.  He is up-to-date on his tetanus from 2021.  No other injuries or concerns at this time.   Laceration   Past Medical History:  Diagnosis Date   Asthma     Patient Active Problem List   Diagnosis Date Noted   Elevated creatine phosphokinase level 10/26/2016   Myalgia 10/26/2016   Asthma 07/08/2015    History reviewed. No pertinent surgical history.     Home Medications    Prior to Admission medications   Medication Sig Start Date End Date Taking? Authorizing Provider  benzonatate (TESSALON) 100 MG capsule Take 1 capsule (100 mg total) by mouth 3 (three) times daily as needed for cough. 04/02/18   Shade Flood, MD  brompheniramine-pseudoephedrine-DM 30-2-10 MG/5ML syrup TAKE 10 ML BY MOUTH EVERY 6 HOURS AS NEEDED 09/02/18   [provider]  budesonide-formoterol (SYMBICORT) 160-4.5 MCG/ACT inhaler Inhale 2 puffs into the lungs 2 (two) times daily. 10/01/18   Kozlow, Alvira Philips, MD  cetirizine (ZYRTEC ALLERGY) 10 MG tablet Take 1 tablet (10 mg total) by mouth daily. 09/30/20   Wallis Bamberg, PA-C  fluticasone (FLONASE) 50 MCG/ACT nasal spray Place 2 sprays into both nostrils daily. 09/30/20   Wallis Bamberg, PA-C  hydrocortisone cream 1 % Apply to affected area 2 times daily 09/30/20   Wallis Bamberg, PA-C  ketoconazole (NIZORAL) 2 % cream Apply 1 application topically daily. 09/30/20   Wallis Bamberg, PA-C  ondansetron (ZOFRAN ODT) 4 MG disintegrating tablet Take 1 tablet (4  mg total) by mouth every 8 (eight) hours as needed for nausea or vomiting. 02/19/20   Wieters, Hallie C, PA-C  pseudoephedrine (SUDAFED) 30 MG tablet Take 1 tablet (30 mg total) by mouth every 8 (eight) hours as needed for congestion. 09/30/20   Wallis Bamberg, PA-C  albuterol (PROVENTIL HFA;VENTOLIN HFA) 108 (90 Base) MCG/ACT inhaler Inhale 1-2 puffs into the lungs every 6 (six) hours as needed for wheezing or shortness of breath. 04/02/18 02/19/20  Shade Flood, MD    Family History Family History  Problem Relation Age of Onset   Diabetes Maternal Grandmother     Social History Social History   Tobacco Use   Smoking status: Some Days    Current packs/day: 0.00    Average packs/day: 0.3 packs/day for 4.0 years (1.0 ttl pk-yrs)    Types: Cigarettes    Start date: 09/28/2013    Last attempt to quit: 09/28/2017    Years since quitting: 5.5   Smokeless tobacco: Never  Vaping Use   Vaping status: Never Used  Substance Use Topics   Alcohol use: Yes    Comment: occasional   Drug use: Yes    Frequency: 7.0 times per week    Types: Marijuana    Comment: daily     Allergies   Patient has no known allergies.   Review of Systems Review of Systems  Skin:  Positive for wound.  Physical Exam Triage Vital Signs ED Triage Vitals  Encounter Vitals Group     BP 03/30/23 1247 110/70     Systolic BP Percentile --      Diastolic BP Percentile --      Pulse Rate 03/30/23 1247 83     Resp 03/30/23 1247 16     Temp 03/30/23 1247 98.8 F (37.1 C)     Temp Source 03/30/23 1247 Oral     SpO2 03/30/23 1247 97 %     Weight --      Height --      Head Circumference --      Peak Flow --      Pain Score 03/30/23 1251 2     Pain Loc --      Pain Education --      Exclude from Growth Chart --    No data found.  Updated Vital Signs BP 110/70 (BP Location: Left Arm)   Pulse 83   Temp 98.8 F (37.1 C) (Oral)   Resp 16   SpO2 97%   Visual Acuity Right Eye Distance:   Left Eye  Distance:   Bilateral Distance:    Right Eye Near:   Left Eye Near:    Bilateral Near:     Physical Exam Vitals and nursing note reviewed.  Constitutional:      General: He is not in acute distress.    Appearance: Normal appearance. He is not ill-appearing.  HENT:     Head: Normocephalic and atraumatic.  Cardiovascular:     Rate and Rhythm: Normal rate.  Pulmonary:     Effort: Pulmonary effort is normal.  Musculoskeletal:       Hands:     Comments: 2cm skin avulsion to right proximal 5th finger. Edges are well approximated. No bleeding. FROM of finger without restriction. Cap refill +2  Skin:    General: Skin is warm and dry.  Neurological:     General: No focal deficit present.     Mental Status: He is alert and oriented to person, place, and time.  Psychiatric:        Mood and Affect: Mood normal.        Behavior: Behavior normal.      UC Treatments / Results  Labs (all labs ordered are listed, but only abnormal results are displayed) Labs Reviewed - No data to display  EKG   Radiology No results found.  Procedures Laceration Repair  Date/Time: 03/30/2023 2:14 PM  Performed by: Radford Pax, NP Authorized by: Radford Pax, NP   Consent:    Consent obtained:  Verbal   Consent given by:  Patient   Risks, benefits, and alternatives were discussed: yes     Risks discussed:  Need for additional repair, poor wound healing, poor cosmetic result, retained foreign body, tendon damage, vascular damage, pain, infection and nerve damage   Alternatives discussed:  Referral, observation and no treatment Universal protocol:    Patient identity confirmed:  Verbally with patient Anesthesia:    Anesthesia method:  None Laceration details:    Location:  Hand   Hand location:  R hand, dorsum   Length (cm):  2 Pre-procedure details:    Preparation:  Patient was prepped and draped in usual sterile fashion Exploration:    Wound exploration: wound explored through full  range of motion     Contaminated: no   Treatment:    Area cleansed with:  Chlorhexidine   Amount of cleaning:  Standard Skin repair:    Repair method:  Tissue adhesive Approximation:    Approximation:  Close Post-procedure details:    Dressing:  Non-adherent dressing   Procedure completion:  Tolerated well, no immediate complications  (including critical care time)  Medications Ordered in UC Medications - No data to display  Initial Impression / Assessment and Plan / UC Course  I have reviewed the triage vital signs and the nursing notes.  Pertinent labs & imaging results that were available during my care of the patient were reviewed by me and considered in my medical decision making (see chart for details).     Wound care and signs of infection reviewed with patient.  Patient is up-to-date on his tetanus.  Follow-up with PCP 2 days for recheck.  ER precautions reviewed. Final Clinical Impressions(s) / UC Diagnoses   Final diagnoses:  Laceration of right hand without foreign body, initial encounter     Discharge Instructions      Keep wound dry and covered for the next 24 hours.  The glue used to close your wound will dissolve on its own over 5 days.  Do not scrub or saturate the wound.  Monitor for any signs of infection which include but not limited to redness, drainage, warmth, fevers or chills and please seek reevaluation if these occur.  Follow-up with your PCP in 2 days for recheck.  Please go to the ER for any worsening symptoms.  I hope you feel better soon!     ED Prescriptions   None    PDMP not reviewed this encounter.   Radford Pax, NP 03/30/23 1416

## 2023-03-30 NOTE — Discharge Instructions (Signed)
Keep wound dry and covered for the next 24 hours.  The glue used to close your wound will dissolve on its own over 5 days.  Do not scrub or saturate the wound.  Monitor for any signs of infection which include but not limited to redness, drainage, warmth, fevers or chills and please seek reevaluation if these occur.  Follow-up with your PCP in 2 days for recheck.  Please go to the ER for any worsening symptoms.  I hope you feel better soon!

## 2023-04-11 ENCOUNTER — Ambulatory Visit
Admission: EM | Admit: 2023-04-11 | Discharge: 2023-04-11 | Disposition: A | Discharge status: Home / Self Care | Payer: Self-pay | Attending: Internal Medicine | Admitting: Internal Medicine

## 2023-04-11 DIAGNOSIS — S61411A Laceration without foreign body of right hand, initial encounter: Secondary | ICD-10-CM

## 2023-04-11 DIAGNOSIS — T148XXD Other injury of unspecified body region, subsequent encounter: Secondary | ICD-10-CM

## 2023-04-11 MED ORDER — BACITRACIN ZINC 500 UNIT/GM EX OINT: 1.0000 | TOPICAL_OINTMENT | Freq: Two times a day (BID) | CUTANEOUS | 0 refills | Status: AC

## 2023-04-11 NOTE — ED Provider Notes (Signed)
[facility]  Note:  This document was prepared using Conservation officer, historic buildings and may include unintentional dictation errors.  MRN: [id number] DOB: 02/17/1991  Subjective:   Dustin Weber is a 32 y.o. male presenting for recheck on a right hand laceration from 03/30/2023.  Patient reports that he initially had adhesive applied to the wound.  The wound has since open.  At times it can weep some.  No fever, drainage of pus or bleeding, redness, swelling.   No current facility-administered medications for this encounter.  Current Outpatient Medications:    budesonide-formoterol (SYMBICORT) 160-4.5 MCG/ACT inhaler, Inhale 2 puffs into the lungs 2 (two) times daily., Disp: 1 Inhaler, Rfl: 5   No Known Allergies  Past Medical History:  Diagnosis Date   Asthma      History reviewed. No pertinent surgical history.  Family History  Problem Relation Age of Onset   Diabetes Maternal Grandmother     Social History   Tobacco Use   Smoking status: Some Days    Current packs/day: 0.00    Average packs/day: 0.3 packs/day for 4.0 years (1.0 ttl pk-yrs)    Types: Cigarettes    Start date: 09/28/2013    Last attempt to quit: 09/28/2017    Years since quitting: 5.5   Smokeless tobacco: Never  Vaping Use   Vaping status: Never Used  Substance Use Topics   Alcohol use: Yes    Comment: occasional   Drug use: Yes    Frequency: 7.0 times per week    Types: Marijuana    Comment: daily    ROS   Objective:   Vitals: BP 119/76 (BP Location: Left Arm)   Pulse 81   Temp 99.2 F (37.3 C) (Oral)   Resp 16   SpO2 97%   Physical Exam Constitutional:      General: He is not in acute distress.    Appearance: Normal appearance. He is well-developed and normal weight. He is not ill-appearing, toxic-appearing or diaphoretic.  HENT:     Head: Normocephalic and atraumatic.     Right Ear: External ear normal.     Left Ear: External ear normal.     Nose:  Nose normal.     Mouth/Throat:     Pharynx: Oropharynx is clear.  Eyes:     General: No scleral icterus.       Right eye: No discharge.        Left eye: No discharge.     Extraocular Movements: Extraocular movements intact.  Cardiovascular:     Rate and Rhythm: Normal rate.  Pulmonary:     Effort: Pulmonary effort is normal.  Musculoskeletal:       Hands:     Cervical back: Normal range of motion.  Neurological:     Mental Status: He is alert and oriented to person, place, and time.  Psychiatric:        Mood and Affect: Mood normal.        Behavior: Behavior normal.        Thought Content: Thought content normal.        Judgment: Judgment normal.    A pressure dressing was applied to the wound.  Assessment and Plan :   PDMP not reviewed this encounter.  1. Encounter for re-check of laceration wound   2. Laceration of right hand without foreign body, initial encounter    I do not believe that Steri-Strips would function well to this area given the  amount of tension the wound is under.  Advised healing by secondary intention.  Discussed applying dressings to the area for the next 2 to 4 weeks.  Reviewed this in detail with the patient.  Use ibuprofen for pain relief.  Counseled patient on potential for adverse effects with medications prescribed/recommended today, ER and return-to-clinic precautions discussed, patient verbalized understanding.    Wallis Bamberg, PA-C 04/11/23 1259

## 2023-04-11 NOTE — ED Triage Notes (Signed)
Pt states here on 9/20 for a lac to rt pinky. States it was glued today. States has hit it a few times and wants its checked. No drainage or redness noted.

## 2023-04-11 NOTE — Discharge Instructions (Addendum)
Alternate between wet and dry dressings. Bacitracin is a good ointment for wounds. Change your dressing 3-5 times daily depending on how active you are with your hands and how dirty they get. Every time you change your dressing, wash your wound with warm soapy water. Make sure it is completely dry before reapplying a dressing. If you can let the wound breathe for roughly 30 minutes to an hour limiting your hand movement during that time before reapplying another dressing.   If you develop redness, drainage of pus, swelling, a hot sensation, fever, then these are signs of infection and you need to be seen again.

## 2023-07-11 ENCOUNTER — Ambulatory Visit
Admission: EM | Admit: 2023-07-11 | Discharge: 2023-07-11 | Disposition: A | Discharge status: Home / Self Care | Payer: 59 | Attending: Family Medicine | Admitting: Family Medicine

## 2023-07-11 DIAGNOSIS — R509 Fever, unspecified: Secondary | ICD-10-CM

## 2023-07-11 DIAGNOSIS — J101 Influenza due to other identified influenza virus with other respiratory manifestations: Secondary | ICD-10-CM | POA: Diagnosis not present

## 2023-07-11 LAB — POCT INFLUENZA A/B
Influenza A, POC: POSITIVE — AB
Influenza B, POC: NEGATIVE

## 2023-07-11 MED ORDER — ACETAMINOPHEN 325 MG PO TABS
650.0000 mg | ORAL_TABLET | Freq: Once | ORAL | Status: AC
  Administered 2023-07-11: 650 mg via ORAL

## 2023-07-11 MED ORDER — BENZONATATE 200 MG PO CAPS: 200.0000 mg | ORAL_CAPSULE | Freq: Three times a day (TID) | ORAL | 0 refills | Status: AC | PRN

## 2023-07-11 MED ORDER — OSELTAMIVIR PHOSPHATE 75 MG PO CAPS: 75.0000 mg | ORAL_CAPSULE | Freq: Two times a day (BID) | ORAL | 0 refills | Status: AC

## 2023-07-11 MED ORDER — ALBUTEROL SULFATE HFA 108 (90 BASE) MCG/ACT IN AERS: 1.0000 | INHALATION_SPRAY | Freq: Four times a day (QID) | RESPIRATORY_TRACT | 0 refills | Status: AC | PRN

## 2023-07-11 NOTE — ED Provider Notes (Signed)
 [facility]    CSN: [id number] Arrival date & time: 07/11/23  1809      History   Chief Complaint No chief complaint on file.   HPI Dustin Weber is a 33 y.o. male  presents for evaluation of URI symptoms for 1 days. Patient reports associated symptoms of cough, congestion, fever, body aches, headache, sore throat.  Denies N/V/D, ear pain or shortness of breath. Patient does have a hx of asthma.  Denied any wheezing or chest tightness with his asthma.  He does not currently have an inhaler.  Patient is a former smoker.  Reports sick contacts via daughter.  Pt has taken DayQuil and NyQuil OTC for symptoms. Pt has no other concerns at this time.   HPI  Past Medical History:  Diagnosis Date   Asthma     Patient Active Problem List   Diagnosis Date Noted   Elevated creatine phosphokinase level 10/26/2016   Myalgia 10/26/2016   Asthma 07/08/2015    History reviewed. No pertinent surgical history.     Home Medications    Prior to Admission medications   Medication Sig Start Date End Date Taking? Authorizing Provider  albuterol  (VENTOLIN  HFA) 108 (90 Base) MCG/ACT inhaler Inhale 1-2 puffs into the lungs every 6 (six) hours as needed. 07/11/23  Yes Moe Graca, Jodi R, NP  benzonatate  (TESSALON ) 200 MG capsule Take 1 capsule (200 mg total) by mouth 3 (three) times daily as needed. 07/11/23  Yes Saraiah Bhat, Jodi R, NP  oseltamivir  (TAMIFLU ) 75 MG capsule Take 1 capsule (75 mg total) by mouth every 12 (twelve) hours. 07/11/23  Yes Anela Bensman, Jodi R, NP  bacitracin  ointment Apply 1 Application topically 2 (two) times daily. 04/11/23   Christopher Savannah, PA-C  budesonide -formoterol  (SYMBICORT ) 160-4.5 MCG/ACT inhaler Inhale 2 puffs into the lungs 2 (two) times daily. 10/01/18   Kozlow, Camellia PARAS, MD    Family History Family History  Problem Relation Age of Onset   Diabetes Maternal Grandmother     Social History Social History   Tobacco Use   Smoking status: Former    Current packs/day: 0.00     Average packs/day: 0.3 packs/day for 4.0 years (1.0 ttl pk-yrs)    Types: Cigarettes    Start date: 09/28/2013    Quit date: 09/28/2017    Years since quitting: 5.7   Smokeless tobacco: Never  Vaping Use   Vaping status: Former  Substance Use Topics   Alcohol use: Yes    Comment: once a month   Drug use: Not Currently    Frequency: 7.0 times per week    Types: Marijuana    Comment: daily     Allergies   Patient has no known allergies.   Review of Systems Review of Systems  Constitutional:  Positive for fever.  HENT:  Positive for congestion and sore throat.   Respiratory:  Positive for cough.   Musculoskeletal:  Positive for myalgias.  Neurological:  Positive for headaches.     Physical Exam Triage Vital Signs ED Triage Vitals  Encounter Vitals Group     BP 07/11/23 1827 123/75     Systolic BP Percentile --      Diastolic BP Percentile --      Pulse Rate 07/11/23 1827 (!) 101     Resp 07/11/23 1827 18     Temp 07/11/23 1827 (!) 102.8 F (39.3 C)     Temp Source 07/11/23 1827 Oral     SpO2 07/11/23 1827 97 %  Weight --      Height --      Head Circumference --      Peak Flow --      Pain Score 07/11/23 1820 8     Pain Loc --      Pain Education --      Exclude from Growth Chart --    No data found.  Updated Vital Signs BP 123/75 (BP Location: Right Arm)   Pulse (!) 101   Temp (!) 102.8 F (39.3 C) (Oral)   Resp 18   SpO2 97%   Visual Acuity Right Eye Distance:   Left Eye Distance:   Bilateral Distance:    Right Eye Near:   Left Eye Near:    Bilateral Near:     Physical Exam Vitals and nursing note reviewed.  Constitutional:      General: He is not in acute distress.    Appearance: Normal appearance. He is not ill-appearing or toxic-appearing.  HENT:     Head: Normocephalic and atraumatic.     Right Ear: Tympanic membrane and ear canal normal.     Left Ear: Tympanic membrane and ear canal normal.     Nose: Congestion present.      Mouth/Throat:     Mouth: Mucous membranes are moist.     Pharynx: Posterior oropharyngeal erythema present.  Eyes:     Pupils: Pupils are equal, round, and reactive to light.  Cardiovascular:     Rate and Rhythm: Regular rhythm. Tachycardia present.     Heart sounds: Normal heart sounds.     Comments: Mildly tachycardia at 101 in setting of fever Pulmonary:     Effort: Pulmonary effort is normal.     Breath sounds: Normal breath sounds.  Musculoskeletal:     Cervical back: Normal range of motion and neck supple.  Lymphadenopathy:     Cervical: No cervical adenopathy.  Skin:    General: Skin is warm and dry.  Neurological:     General: No focal deficit present.     Mental Status: He is alert and oriented to person, place, and time.  Psychiatric:        Mood and Affect: Mood normal.        Behavior: Behavior normal.      UC Treatments / Results  Labs (all labs ordered are listed, but only abnormal results are displayed) Labs Reviewed  POCT INFLUENZA A/B - Abnormal; Notable for the following components:      Result Value   Influenza A, POC Positive (*)    All other components within normal limits    EKG   Radiology No results found.  Procedures Procedures (including critical care time)  Medications Ordered in UC Medications  acetaminophen  (TYLENOL ) tablet 650 mg (650 mg Oral Given 07/11/23 1832)    Initial Impression / Assessment and Plan / UC Course  I have reviewed the triage vital signs and the nursing notes.  Pertinent labs & imaging results that were available during my care of the patient were reviewed by me and considered in my medical decision making (see chart for details).     Reviewed exam and symptoms with patient.  Tylenol  given in clinic for fever.  No red flags.  Positive influenza A.  Given asthma history we will start Tamiflu .  Albuterol  inhaler..  Tessalon  as needed for cough.  Continue Tylenol  or ibuprofen  as needed for fever management.   Encourage fluids and rest.  PCP follow-up as symptoms do not  improve.  ER precautions reviewed. Final Clinical Impressions(s) / UC Diagnoses   Final diagnoses:  Fever, unspecified  Influenza A     Discharge Instructions      You have tested positive for influenza A.  You may start Tamiflu  twice daily for 5 days to help reduce severity of your symptoms.  Tessalon  as needed for cough.  I have sent an albuterol  inhaler to use as needed for shortness of breath or wheezing.  Use over-the-counter Tylenol  or ibuprofen  as needed for fever management.  Lots of rest and fluids.  Please follow-up with your PCP if your symptoms do not improve.  Please go to the ER if you develop any worsening symptoms.  I hope you feel better soon!     ED Prescriptions     Medication Sig Dispense Auth. Provider   oseltamivir  (TAMIFLU ) 75 MG capsule Take 1 capsule (75 mg total) by mouth every 12 (twelve) hours. 10 capsule Neenah Canter, Jodi R, NP   albuterol  (VENTOLIN  HFA) 108 (90 Base) MCG/ACT inhaler Inhale 1-2 puffs into the lungs every 6 (six) hours as needed. 1 each Alean Kromer, Jodi R, NP   benzonatate  (TESSALON ) 200 MG capsule Take 1 capsule (200 mg total) by mouth 3 (three) times daily as needed. 20 capsule Robin Pafford, Jodi R, NP      PDMP not reviewed this encounter.   Loreda Myla SAUNDERS, NP 07/11/23 [phone]

## 2023-07-11 NOTE — ED Triage Notes (Signed)
 Pt presents to UC for c/o fever, headache, body aches, coughing, nasal drainage since yesterday. Dayquil and nyquil give no relief.

## 2023-07-11 NOTE — Discharge Instructions (Signed)
 You have tested positive for influenza A.  You may start Tamiflu  twice daily for 5 days to help reduce severity of your symptoms.  Tessalon  as needed for cough.  I have sent an albuterol  inhaler to use as needed for shortness of breath or wheezing.  Use over-the-counter Tylenol  or ibuprofen  as needed for fever management.  Lots of rest and fluids.  Please follow-up with your PCP if your symptoms do not improve.  Please go to the ER if you develop any worsening symptoms.  I hope you feel better soon!

## 2023-08-28 ENCOUNTER — Ambulatory Visit
Admission: EM | Admit: 2023-08-28 | Discharge: 2023-08-28 | Disposition: A | Discharge status: Home / Self Care | Payer: 59 | Attending: Family Medicine | Admitting: Family Medicine

## 2023-08-28 DIAGNOSIS — K529 Noninfective gastroenteritis and colitis, unspecified: Secondary | ICD-10-CM | POA: Diagnosis not present

## 2023-08-28 MED ORDER — ONDANSETRON 4 MG PO TBDP: 4.0000 mg | ORAL_TABLET | Freq: Three times a day (TID) | ORAL | 0 refills | Status: AC | PRN

## 2023-08-28 MED ORDER — AZITHROMYCIN 500 MG PO TABS: 500.0000 mg | ORAL_TABLET | Freq: Every day | ORAL | 0 refills | Status: AC

## 2023-08-28 NOTE — ED Triage Notes (Signed)
 Pt presents with c/o stomach pain, diarrhea and vomiting x 4 days.   Home interventions; Pepto Bismol, drinking Pedialyte and ginger pills

## 2023-08-28 NOTE — Discharge Instructions (Addendum)
 You may take Zofran every 8 hours as needed for nausea and vomiting.  A provisional prescription for Zithromax has been provided.  Take only if your diarrhea symptoms do not improve or they worsen.  Otherwise focus on hydration/electrolyte replacement with Gatorade, Powerade, Pedialyte, water.  Please follow-up with your PCP in 2 to 3 days for recheck.  Please go to the ER for any worsening symptoms.  Hope you feel better soon!

## 2023-08-28 NOTE — ED Provider Notes (Signed)
 [facility]    CSN: [id number] Arrival date & time: 08/28/23  0941      History   Chief Complaint No chief complaint on file.   HPI Dustin Weber is a 33 y.o. male presents for gastroenteritis type symptoms.  Patient reports 4 days of nonbloody vomit and diarrhea.  Endorses abdominal cramping as well.  No fevers, URI symptoms, dysuria.  Does state he recently traveled back from [place] and symptoms started when he was there.  Does endorse sick contacts with his travel mates as well.  No history of GI diagnoses such as Crohn's, IBS, colitis.  States he is only had nausea and no vomiting x 24 hours.  Continues to have watery diarrhea but feels like the frequency is improving.  He has taken Pepto-Bismol and over-the-counter nausea medication with good improvement in symptoms.  No other concerns at this time.  HPI  Past Medical History:  Diagnosis Date   Asthma     Patient Active Problem List   Diagnosis Date Noted   Elevated creatine phosphokinase level 10/26/2016   Myalgia 10/26/2016   Asthma 07/08/2015    History reviewed. No pertinent surgical history.     Home Medications    Prior to Admission medications   Medication Sig Start Date End Date Taking? Authorizing Provider  azithromycin (ZITHROMAX) 500 MG tablet Take 1 tablet (500 mg total) by mouth daily for 3 days. 08/28/23 08/31/23 Yes Radford Pax, NP  ondansetron (ZOFRAN-ODT) 4 MG disintegrating tablet Take 1 tablet (4 mg total) by mouth every 8 (eight) hours as needed for nausea or vomiting. 08/28/23  Yes Radford Pax, NP  albuterol (VENTOLIN HFA) 108 (90 Base) MCG/ACT inhaler Inhale 1-2 puffs into the lungs every 6 (six) hours as needed. 07/11/23   Radford Pax, NP  bacitracin ointment Apply 1 Application topically 2 (two) times daily. 04/11/23   Wallis Bamberg, PA-C  benzonatate (TESSALON) 200 MG capsule Take 1 capsule (200 mg total) by mouth 3 (three) times daily as needed. 07/11/23   Radford Pax, NP   budesonide-formoterol [facility]) 160-4.5 MCG/ACT inhaler Inhale 2 puffs into the lungs 2 (two) times daily. 10/01/18   Kozlow, Alvira Philips, MD  oseltamivir (TAMIFLU) 75 MG capsule Take 1 capsule (75 mg total) by mouth every 12 (twelve) hours. 07/11/23   Radford Pax, NP    Family History Family History  Problem Relation Age of Onset   Diabetes Maternal Grandmother     Social History Social History   Tobacco Use   Smoking status: Former    Current packs/day: 0.00    Average packs/day: 0.3 packs/day for 4.0 years (1.0 ttl pk-yrs)    Types: Cigarettes    Start date: 09/28/2013    Quit date: 09/28/2017    Years since quitting: 5.9   Smokeless tobacco: Never  Vaping Use   Vaping status: Former  Substance Use Topics   Alcohol use: Yes    Comment: once a month   Drug use: Not Currently    Frequency: 7.0 times per week    Types: Marijuana    Comment: daily     Allergies   Patient has no known allergies.   Review of Systems Review of Systems  Gastrointestinal:  Positive for abdominal pain, diarrhea, nausea and vomiting.     Physical Exam Triage Vital Signs ED Triage Vitals  Encounter Vitals Group     BP 08/28/23 1005 130/79     Systolic BP Percentile --  Diastolic BP Percentile --      Pulse Rate 08/28/23 1005 82     Resp 08/28/23 1005 17     Temp 08/28/23 1005 98.8 F (37.1 C)     Temp Source 08/28/23 1005 Oral     SpO2 08/28/23 1005 98 %     Weight --      Height --      Head Circumference --      Peak Flow --      Pain Score 08/28/23 1004 7     Pain Loc --      Pain Education --      Exclude from Growth Chart --    No data found.  Updated Vital Signs BP 130/79 (BP Location: Left Arm)   Pulse 82   Temp 98.8 F (37.1 C) (Oral)   Resp 17   SpO2 98%   Visual Acuity Right Eye Distance:   Left Eye Distance:   Bilateral Distance:    Right Eye Near:   Left Eye Near:    Bilateral Near:     Physical Exam Vitals and nursing note reviewed.   Constitutional:      General: He is not in acute distress.    Appearance: Normal appearance. He is not ill-appearing.  HENT:     Head: Normocephalic and atraumatic.  Eyes:     Pupils: Pupils are equal, round, and reactive to light.  Cardiovascular:     Rate and Rhythm: Normal rate.  Pulmonary:     Effort: Pulmonary effort is normal.  Abdominal:     General: Abdomen is flat. Bowel sounds are normal.     Palpations: Abdomen is soft.     Tenderness: There is generalized abdominal tenderness. Negative signs include Rovsing's sign and McBurney's sign.  Skin:    General: Skin is warm and dry.  Neurological:     General: No focal deficit present.     Mental Status: He is alert and oriented to person, place, and time.  Psychiatric:        Mood and Affect: Mood normal.        Behavior: Behavior normal.      UC Treatments / Results  Labs (all labs ordered are listed, but only abnormal results are displayed) Labs Reviewed - No data to display  EKG   Radiology No results found.  Procedures Procedures (including critical care time)  Medications Ordered in UC Medications - No data to display  Initial Impression / Assessment and Plan / UC Course  I have reviewed the triage vital signs and the nursing notes.  Pertinent labs & imaging results that were available during my care of the patient were reviewed by me and considered in my medical decision making (see chart for details).     Reviewed exam and symptoms with patient.  No red flags.  Discussed likely viral enteritis and symptomatic treatment.  Rx Zofran sent to pharmacy.  Did discuss fluids/electrolyte replacement and rest.  Will do provisional prescription for Zithromax given he has recently traveled and is having diarrhea.  He was instructed only to take if his diarrhea symptoms do not continue to improve and/or they worsen.  Follow-up with PCP 2 to 3 days for recheck.  Strict ER precautions reviewed and patient verbalized  understanding. Final Clinical Impressions(s) / UC Diagnoses   Final diagnoses:  Gastroenteritis     Discharge Instructions      You may take Zofran every 8 hours as needed for nausea and  vomiting.  A provisional prescription for Zithromax has been provided.  Take only if your diarrhea symptoms do not improve or they worsen.  Otherwise focus on hydration/electrolyte replacement with Gatorade, Powerade, Pedialyte, water.  Please follow-up with your PCP in 2 to 3 days for recheck.  Please go to the ER for any worsening symptoms.  Hope you feel better soon!    ED Prescriptions     Medication Sig Dispense Auth. Provider   ondansetron (ZOFRAN-ODT) 4 MG disintegrating tablet Take 1 tablet (4 mg total) by mouth every 8 (eight) hours as needed for nausea or vomiting. 10 tablet Radford Pax, NP   azithromycin (ZITHROMAX) 500 MG tablet Take 1 tablet (500 mg total) by mouth daily for 3 days. 3 tablet Radford Pax, NP      PDMP not reviewed this encounter.   Radford Pax, NP 08/28/23 1043
